# Patient Record
Sex: Female | Born: 1980 | Race: Black or African American | Hispanic: No | Marital: Married | Smoking: Current every day smoker
Health system: Southern US, Community
[De-identification: ages and names within clinical notes are randomized; demographics above are authoritative.]

## PROBLEM LIST (undated history)

## (undated) HISTORY — PX: KNEE ARTHROSCOPY: SHX127

---

## 2021-07-03 ENCOUNTER — Observation Stay (HOSPITAL_COMMUNITY): Payer: Medicaid - Out of State

## 2021-07-03 ENCOUNTER — Emergency Department (HOSPITAL_COMMUNITY): Payer: Medicaid - Out of State

## 2021-07-03 ENCOUNTER — Other Ambulatory Visit: Payer: Self-pay

## 2021-07-03 ENCOUNTER — Inpatient Hospital Stay (HOSPITAL_COMMUNITY)
Admission: EM | Admit: 2021-07-03 | Discharge: 2021-07-04 | DRG: 909 | Disposition: A | Discharge status: Home / Self Care | Payer: Medicaid - Out of State | Attending: Internal Medicine | Admitting: Internal Medicine

## 2021-07-03 DIAGNOSIS — T148XXA Other injury of unspecified body region, initial encounter: Principal | ICD-10-CM

## 2021-07-03 DIAGNOSIS — Z720 Tobacco use: Secondary | ICD-10-CM

## 2021-07-03 DIAGNOSIS — S8261XA Displaced fracture of lateral malleolus of right fibula, initial encounter for closed fracture: Secondary | ICD-10-CM | POA: Diagnosis present

## 2021-07-03 DIAGNOSIS — F17210 Nicotine dependence, cigarettes, uncomplicated: Secondary | ICD-10-CM | POA: Diagnosis present

## 2021-07-03 DIAGNOSIS — Z716 Tobacco abuse counseling: Secondary | ICD-10-CM

## 2021-07-03 DIAGNOSIS — S82891B Other fracture of right lower leg, initial encounter for open fracture type I or II: Secondary | ICD-10-CM

## 2021-07-03 DIAGNOSIS — Z23 Encounter for immunization: Secondary | ICD-10-CM

## 2021-07-03 DIAGNOSIS — S82891A Other fracture of right lower leg, initial encounter for closed fracture: Secondary | ICD-10-CM | POA: Diagnosis present

## 2021-07-03 DIAGNOSIS — W230XXA Caught, crushed, jammed, or pinched between moving objects, initial encounter: Secondary | ICD-10-CM | POA: Diagnosis present

## 2021-07-03 DIAGNOSIS — Z6833 Body mass index (BMI) 33.0-33.9, adult: Secondary | ICD-10-CM

## 2021-07-03 DIAGNOSIS — Z20822 Contact with and (suspected) exposure to covid-19: Secondary | ICD-10-CM | POA: Diagnosis present

## 2021-07-03 DIAGNOSIS — S8781XA Crushing injury of right lower leg, initial encounter: Principal | ICD-10-CM | POA: Diagnosis present

## 2021-07-03 DIAGNOSIS — W1839XA Other fall on same level, initial encounter: Secondary | ICD-10-CM | POA: Diagnosis present

## 2021-07-03 DIAGNOSIS — S82899A Other fracture of unspecified lower leg, initial encounter for closed fracture: Secondary | ICD-10-CM

## 2021-07-03 DIAGNOSIS — E669 Obesity, unspecified: Secondary | ICD-10-CM | POA: Diagnosis present

## 2021-07-03 HISTORY — DX: Other fracture of unspecified lower leg, initial encounter for closed fracture: S82.899A

## 2021-07-03 LAB — COMPREHENSIVE METABOLIC PANEL
ALT: 10 U/L (ref 0–44)
AST: 18 U/L (ref 15–41)
Albumin: 4.1 g/dL (ref 3.5–5.0)
Alkaline Phosphatase: 56 U/L (ref 38–126)
Anion gap: 9 (ref 5–15)
BUN: 10 mg/dL (ref 6–20)
CO2: 21 mmol/L — ABNORMAL LOW (ref 22–32)
Calcium: 9.2 mg/dL (ref 8.9–10.3)
Chloride: 110 mmol/L (ref 98–111)
Creatinine, Ser: 0.91 mg/dL (ref 0.44–1.00)
GFR, Estimated: >60 mL/min (ref >60)
Glucose, Bld: 114 mg/dL — ABNORMAL HIGH (ref 70–99)
Potassium: 3.7 mmol/L (ref 3.5–5.1)
Sodium: 140 mmol/L (ref 135–145)
Total Bilirubin: 0.4 mg/dL (ref 0.3–1.2)
Total Protein: 6.9 g/dL (ref 6.5–8.1)

## 2021-07-03 LAB — ETHANOL: Alcohol, Ethyl (B): <10 mg/dL (ref <10)

## 2021-07-03 LAB — CBC
HCT: 40.2 % (ref 36.0–46.0)
Hemoglobin: 13.5 g/dL (ref 12.0–15.0)
MCH: 29.6 pg (ref 26.0–34.0)
MCHC: 33.6 g/dL (ref 30.0–36.0)
MCV: 88.2 fL (ref 80.0–100.0)
Platelets: 171 10*3/uL (ref 150–400)
RBC: 4.56 MIL/uL (ref 3.87–5.11)
RDW: 13.4 % (ref 11.5–15.5)
WBC: 6.2 10*3/uL (ref 4.0–10.5)
nRBC: 0 % (ref 0.0–0.2)

## 2021-07-03 LAB — I-STAT CHEM 8, ED
BUN: 10 mg/dL (ref 6–20)
Calcium, Ion: 1.05 mmol/L — ABNORMAL LOW (ref 1.15–1.40)
Chloride: 109 mmol/L (ref 98–111)
Creatinine, Ser: 0.8 mg/dL (ref 0.44–1.00)
Glucose, Bld: 112 mg/dL — ABNORMAL HIGH (ref 70–99)
HCT: 40 % (ref 36.0–46.0)
Hemoglobin: 13.6 g/dL (ref 12.0–15.0)
Potassium: 3.8 mmol/L (ref 3.5–5.1)
Sodium: 141 mmol/L (ref 135–145)
TCO2: 23 mmol/L (ref 22–32)

## 2021-07-03 LAB — PROTIME-INR
INR: 1 (ref 0.8–1.2)
Prothrombin Time: 12.7 seconds (ref 11.4–15.2)

## 2021-07-03 LAB — SAMPLE TO BLOOD BANK

## 2021-07-03 LAB — LACTIC ACID, PLASMA: Lactic Acid, Venous: 2.1 mmol/L (ref 0.5–1.9)

## 2021-07-03 LAB — RESP PANEL BY RT-PCR (FLU A&B, COVID) ARPGX2
Influenza A by PCR: NEGATIVE
Influenza B by PCR: NEGATIVE
SARS Coronavirus 2 by RT PCR: NEGATIVE

## 2021-07-03 LAB — I-STAT BETA HCG BLOOD, ED (MC, WL, AP ONLY): I-stat hCG, quantitative: <5 m[IU]/mL (ref <5)

## 2021-07-03 IMAGING — CT CT TIBIA FIBULA *R* W/O CM
2 series · 10 of 14 positions shown, 12 images · non-contrast
Comparison: Right tib-fib, ankle and foot films earlier today.

CLINICAL DATA: Open fracture right ankle.

EXAM:
CT OF THE LOWER RIGHT EXTREMITY WITHOUT CONTRAST
TECHNIQUE: Multidetector CT imaging of the right lower extremity was performed
according to the standard protocol.
RADIATION DOSE REDUCTION: This exam was performed according to the
departmental dose-optimization program which includes automated
exposure control, adjustment of the mA and/or kV according to
patient size and/or use of iterative reconstruction technique.

[Series 4: rt tib/fib 1.5 ax st · axial · 0.31mm/px · z∈[+102,+406]mm · 5 of 310 slices shown]
[im 52/310  bone]
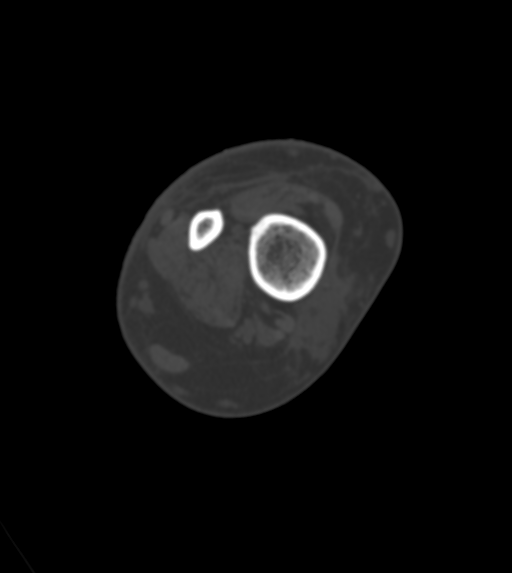
[im 104/310  bone]
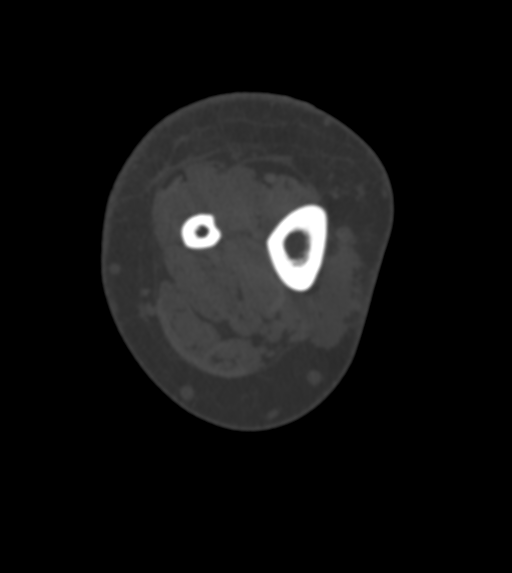
[im 155/310  bone]
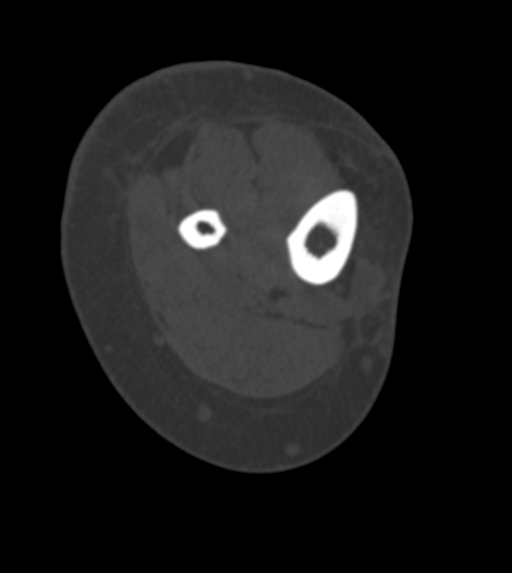
[im 207/310  bone]
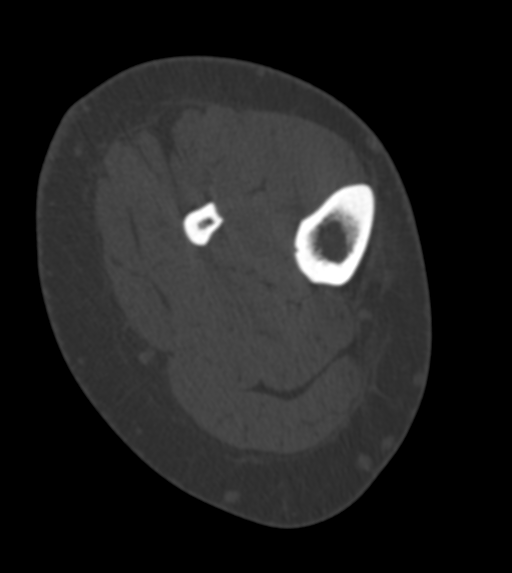
[im 258/310  bone]
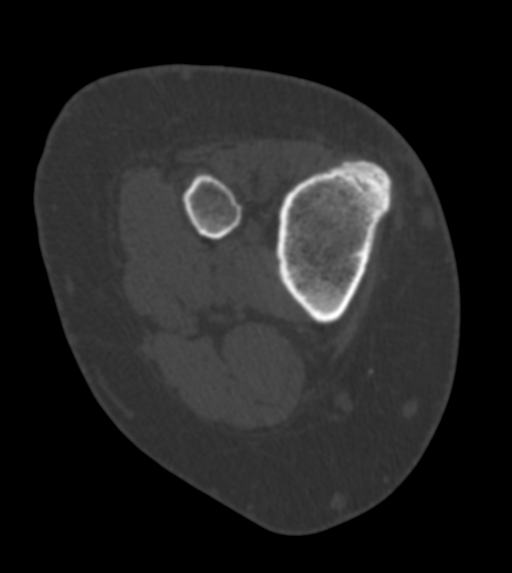

[Series 12: rt tib/fib ax (id) bone · axial · 0.31mm/px · z∈[+102,+408]mm · 5 of 311 slices shown, 7 images]
[im 52/311  soft-tissue]
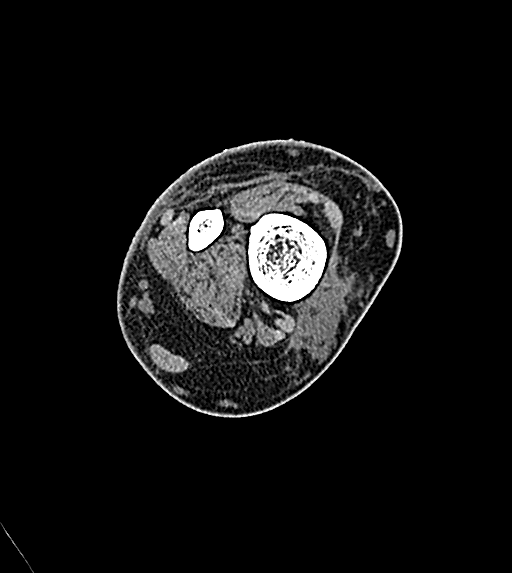
[im 52/311  bone]
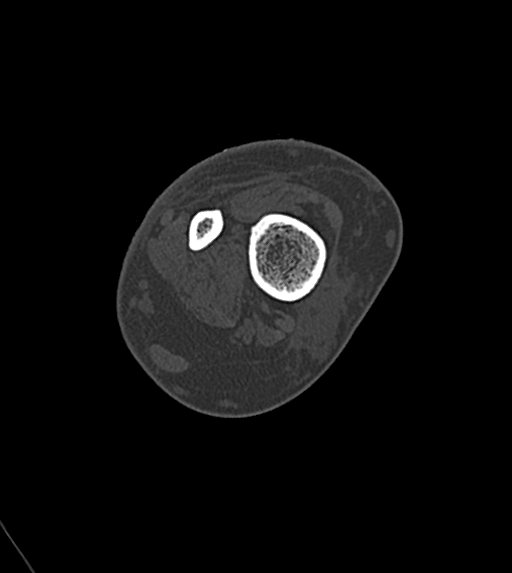
[im 104/311  bone]
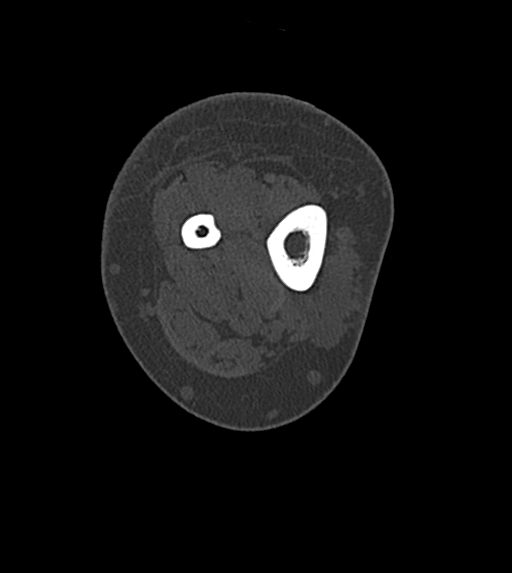
[im 156/311  bone]
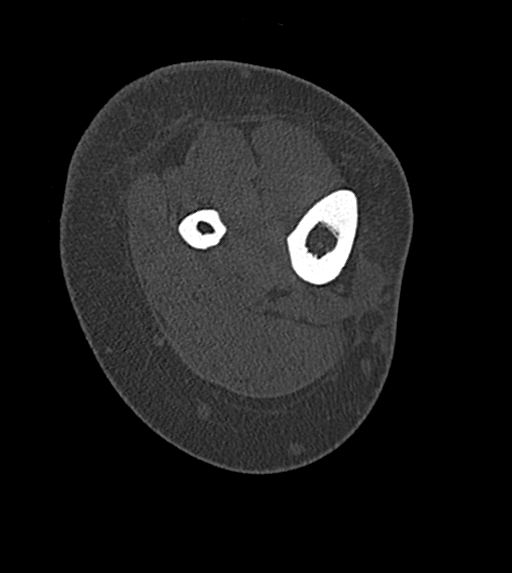
[im 207/311  bone]
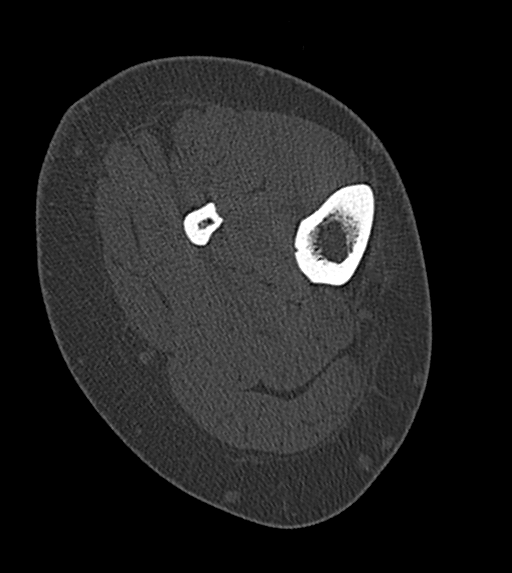
[im 259/311  soft-tissue]
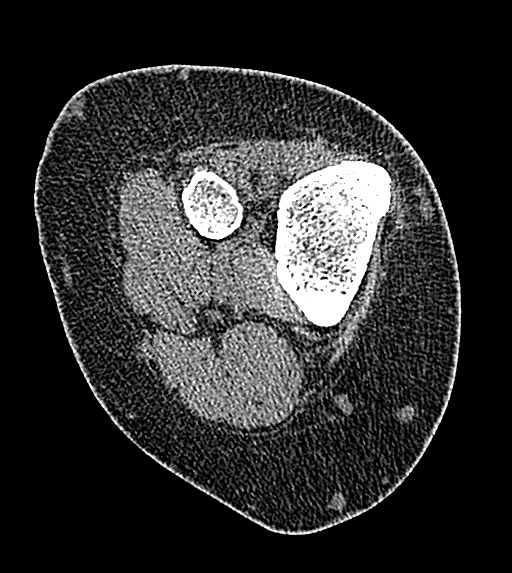
[im 259/311  bone]
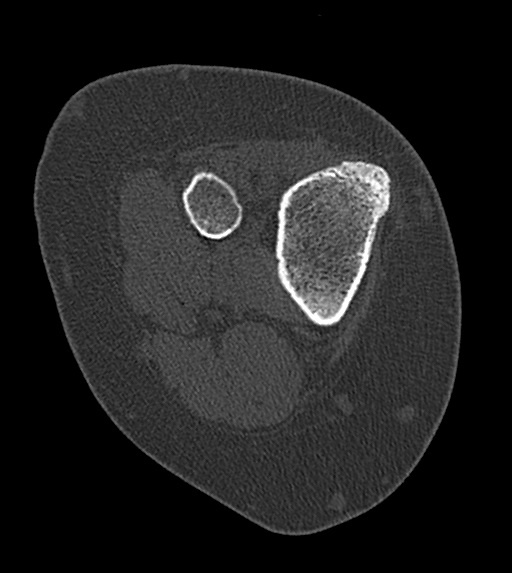

[10 of 14 positions shown; findings below may reference images not displayed]

FINDINGS: Bones/Joint/Cartilage

Normal bone mineralization. There is a comminuted open fracture
involving the distal half of the lateral malleolus with some of the
fragments nondisplaced and some of the more lateral fragments
displaced slightly laterally. There is an overlying open skin wound
extending down to the bone, with air in the fibulotalar and
tibiotalar joint space.

There also is a nondisplaced intra-articular fracture of the lateral
aspect of the head of the fibula along the proximal tibiofibular
joint. This is best seen on the coronal reformatted images.

Remainder of the fibula, as well as the tibia, the talus and talar
dome, and the superior aspect of the calcaneus are intact.

The joint spaces are maintained including along the ankle mortise.
There is mild spurring at the femorotibial joint margins.

Ligaments

Suboptimally assessed by CT.

Muscles and Tendons

Also not well evaluated with CT. There is normal muscle bulk in the
foreleg. Major tendons are visible with no through and through
tendon tears being seen. Tendon strains and lacerations are not
evaluated for with this technique. The peroneal tendons are just
behind the lateral malleolus and could be contused or lacerated
given the mechanism of injury, but they are both visible.

Soft tissues

As above there is an open wound along the lateral malleolus
extending down to the bone, with tibiotalar and fibulotalar
intra-articular gas and moderate swelling is seen in the area as
well as anteriorly and medially at the level of the ankle.

There is nonlocalizing fluid or blood extending along the medial fat
muscle interfaces in the distal foreleg down to the ankle level.

Incidentally noted are varicose veins in the subcutaneous depth at
the level of the knee and foreleg.
IMPRESSION: 1. Comminuted open fracture of the distal lateral malleolus with
intra-articular air and overlying open wound.
2. Nondisplaced intra-articular fracture of the lateral aspect of
the fibular head.
3. No further evidence of fractures. Early degenerative change of
the femorotibial joint.
4. Nonlocalizing fluid or blood extending along the fat muscle
interfaces medially in the mid to distal foreleg. No intramuscular
hematoma is seen.
5. Ligaments and tendons not well evaluated with this technique. The
peroneal tendons are just posterior to the lateral malleolus
however, and could have undergone contusion or laceration.
6. Varicose veins.

## 2021-07-03 IMAGING — DX DG FOOT 2V*R*
2 series · 2 of 2 positions shown · non-contrast
Comparison: None.

CLINICAL DATA: Trauma.

EXAM:
RIGHT FOOT - 2 VIEW

[foot]
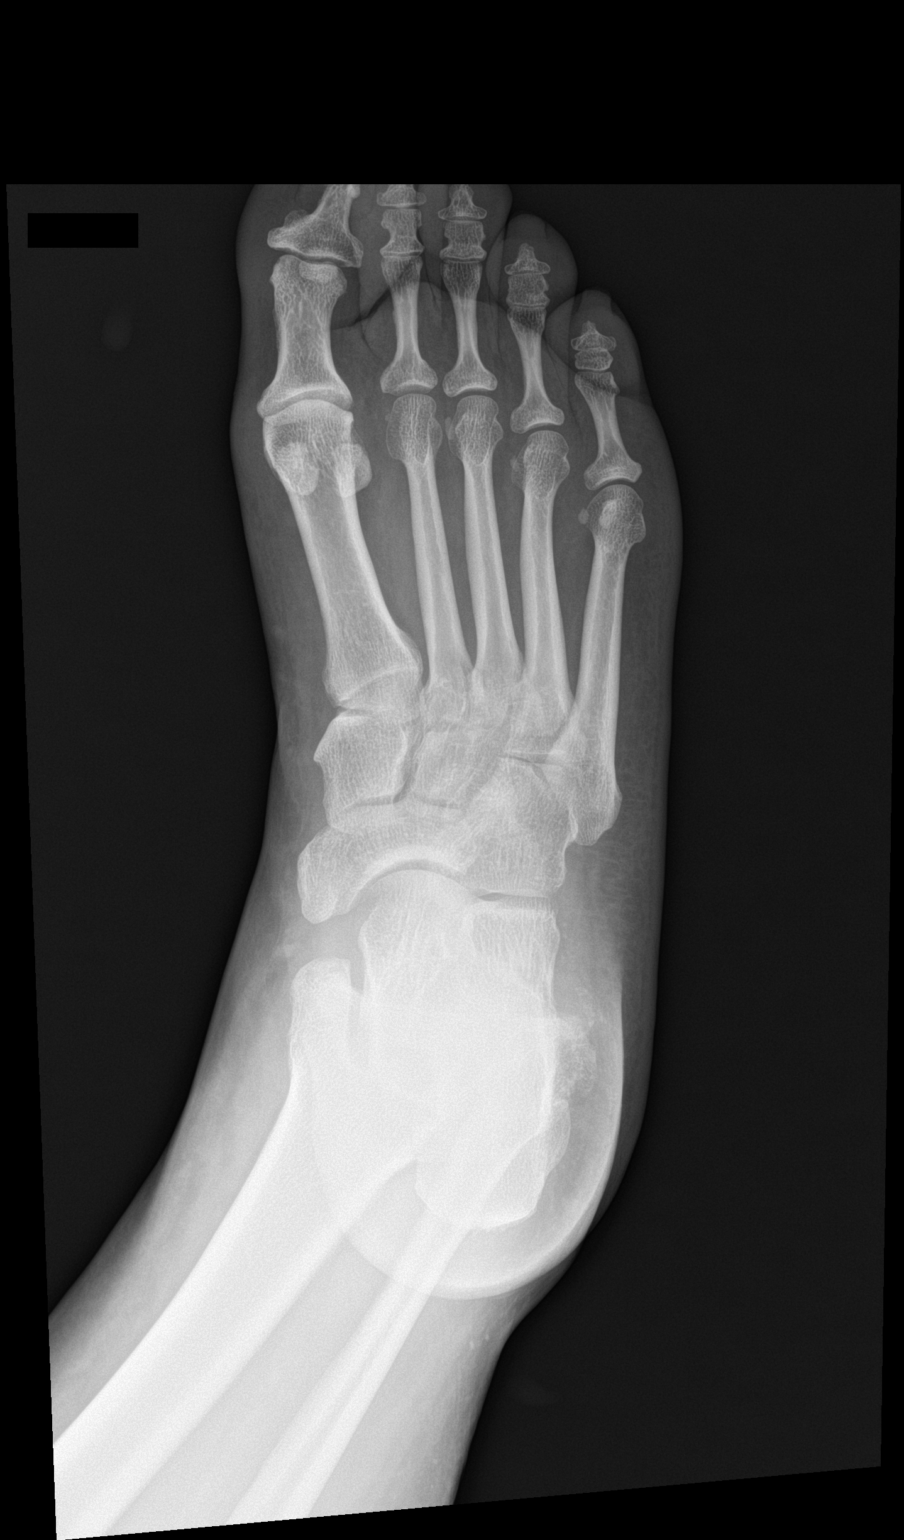

[leg]
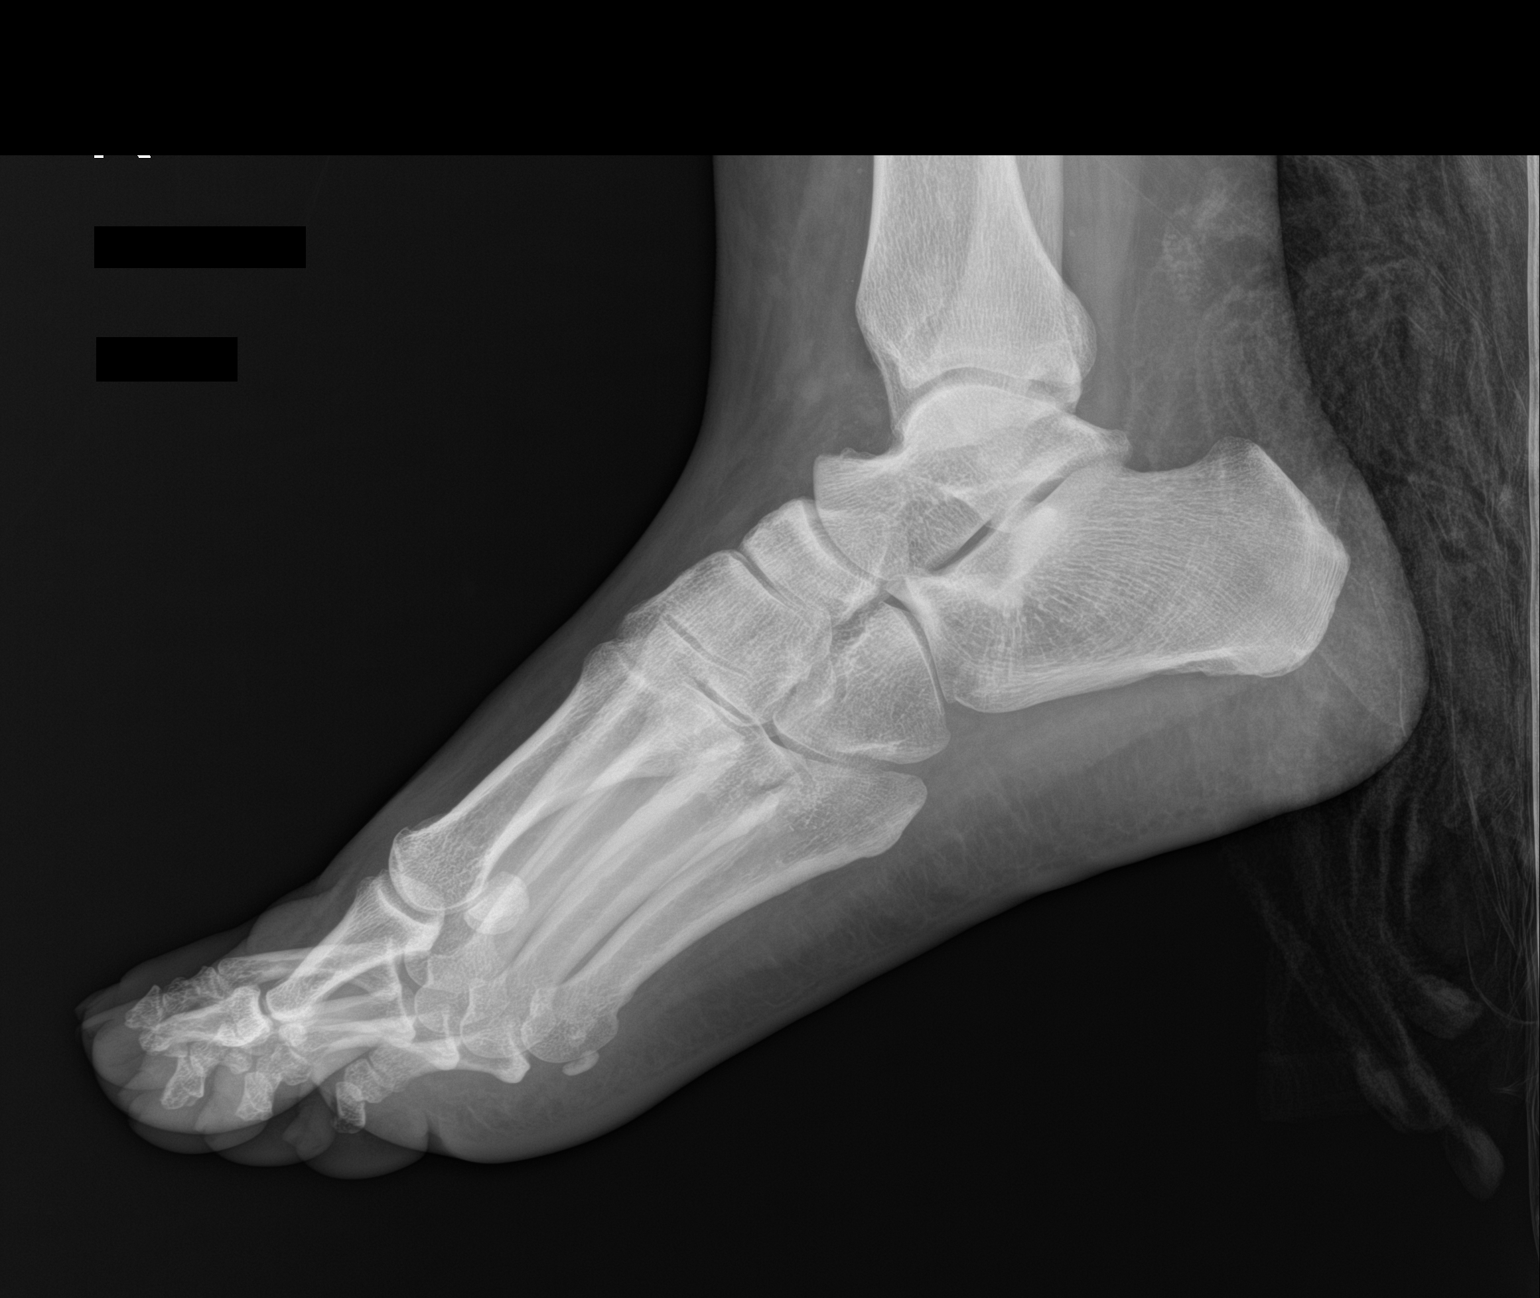

[2 of 2 positions shown; findings below may reference images not displayed]

FINDINGS: There is no evidence of fracture or dislocation. There is no
evidence of arthropathy or other focal bone abnormality. Soft
tissues are unremarkable.
IMPRESSION: Negative.

## 2021-07-03 IMAGING — DX DG TIBIA/FIBULA 2V*R*
1 series · 3 of 3 positions shown · non-contrast
Comparison: None.

CLINICAL DATA: Trauma

EXAM:
RIGHT TIBIA AND FIBULA - 2 VIEW

[Series 1: leg · 0.14mm/px · 3 of 3 slices shown]
[im 1/3]
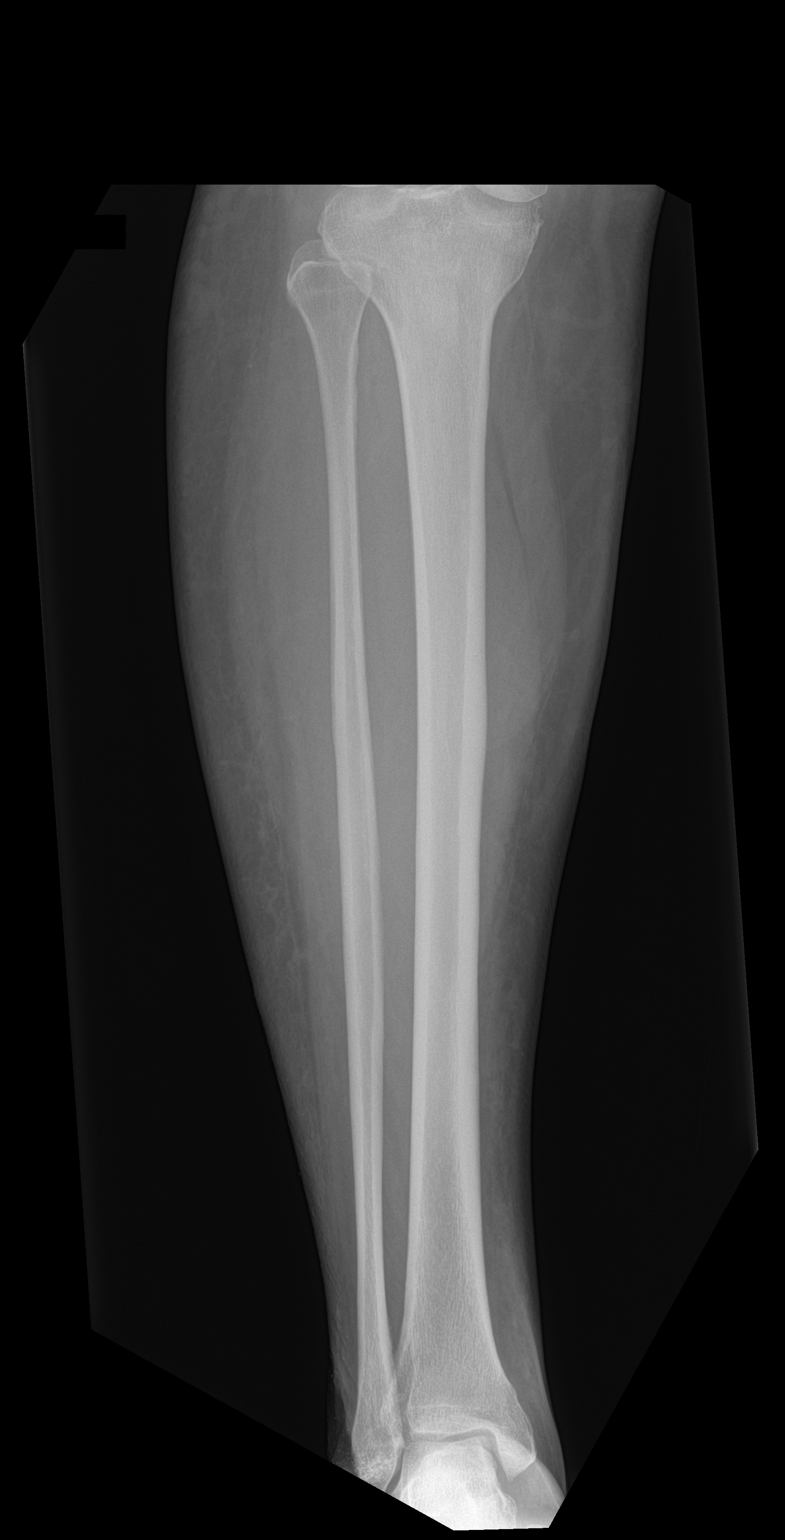
[im 2/3]
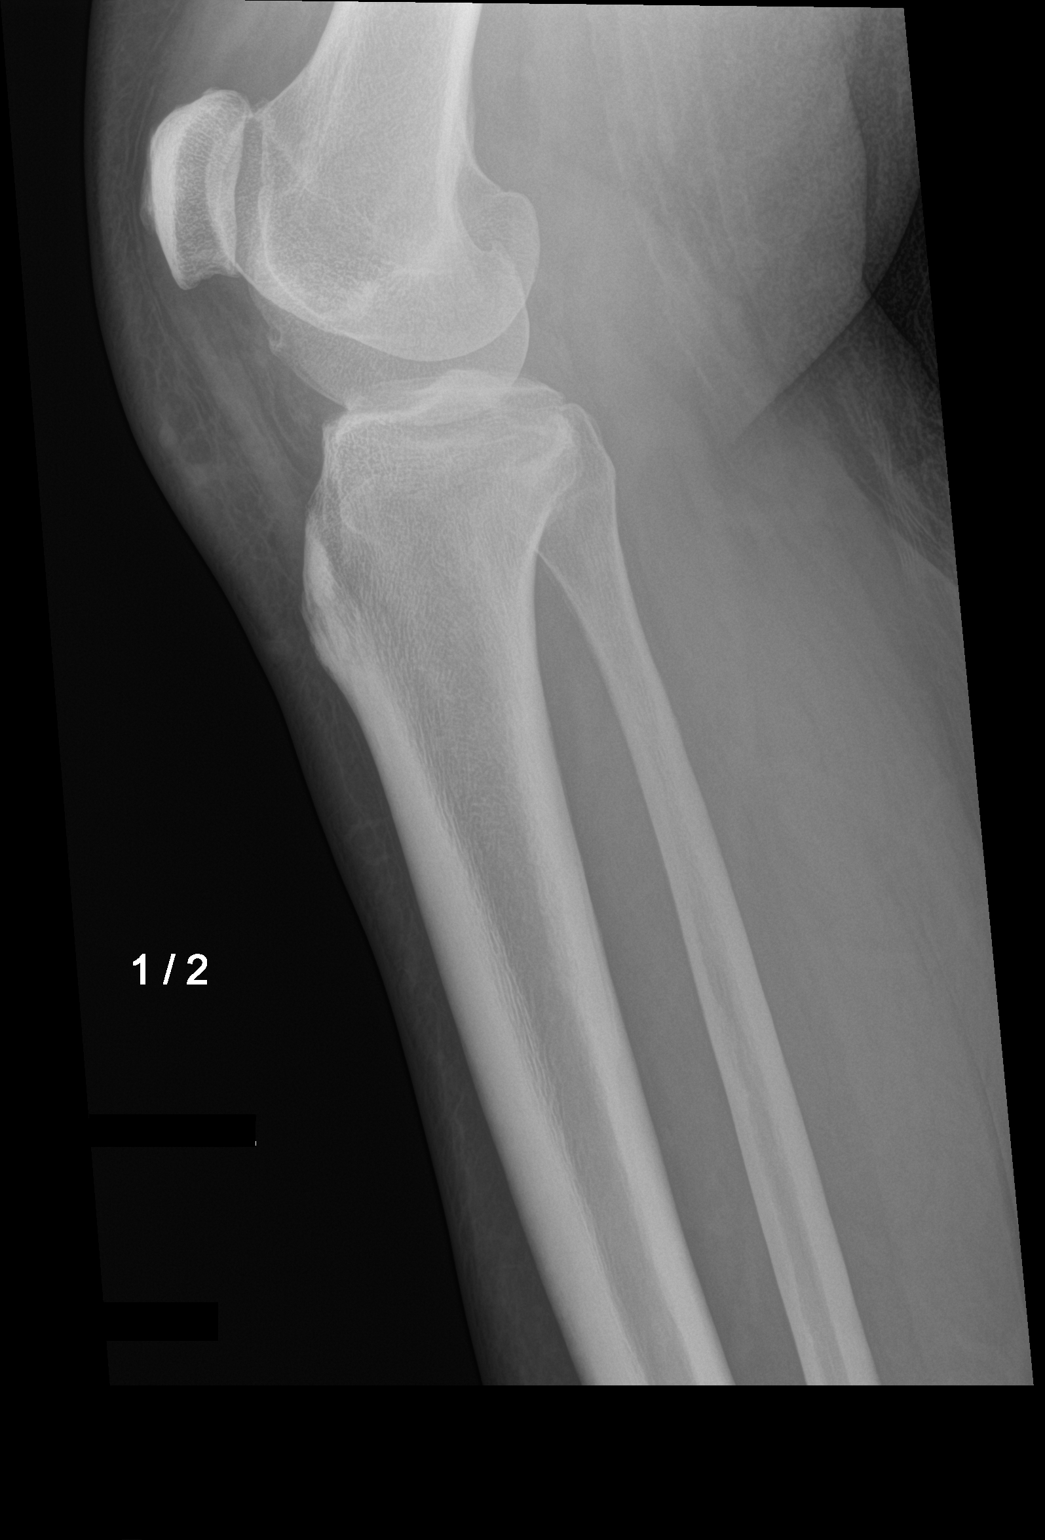
[im 3/3]
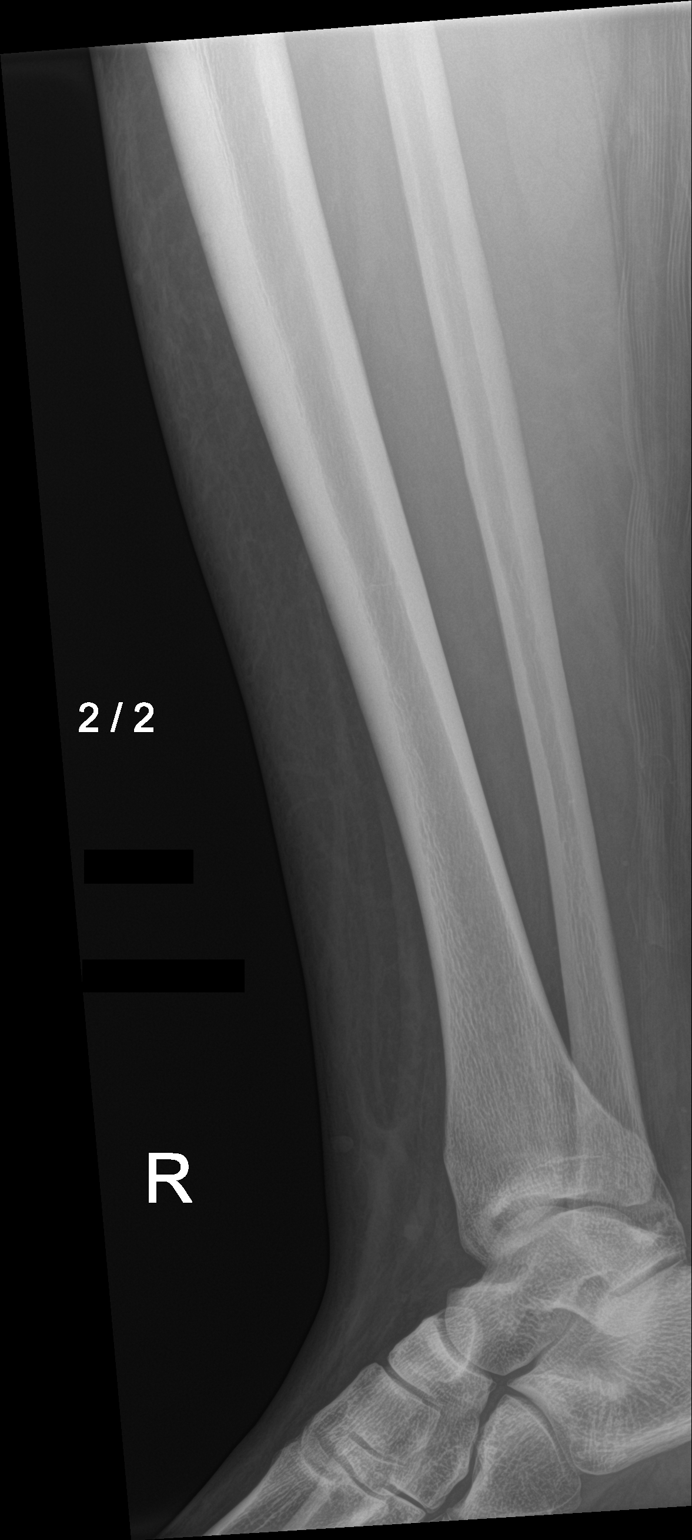

[3 of 3 positions shown; findings below may reference images not displayed]

FINDINGS: There is no evidence of fracture or other focal bone lesions. Soft
tissues are unremarkable.
IMPRESSION: Negative.
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
## 2021-07-03 MED ORDER — ONDANSETRON HCL 4 MG/2ML IJ SOLN
4.0000 mg | Freq: Four times a day (QID) | INTRAMUSCULAR | Status: DC | PRN
Start: 2021-07-03 — End: 2021-07-04

## 2021-07-03 MED ORDER — MAGNESIUM HYDROXIDE 400 MG/5ML PO SUSP
30.0000 mL | Freq: Every day | ORAL | Status: DC | PRN
Start: 2021-07-03 — End: 2021-07-04
  Filled 2021-07-03: qty 30

## 2021-07-03 MED ORDER — TRAZODONE HCL 50 MG PO TABS: 25.0000 mg | ORAL_TABLET | Freq: Every evening | ORAL | Status: DC | PRN

## 2021-07-03 MED ORDER — HYDROMORPHONE HCL 1 MG/ML IJ SOLN
1.0000 mg | INTRAMUSCULAR | Status: AC | PRN
  Administered 2021-07-03 – 2021-07-04 (×3): 1 mg via INTRAVENOUS
  Filled 2021-07-03 (×3): qty 1

## 2021-07-03 MED ORDER — FENTANYL CITRATE PF 50 MCG/ML IJ SOSY: 50.0000 ug | PREFILLED_SYRINGE | Freq: Once | INTRAMUSCULAR | Status: AC

## 2021-07-03 MED ORDER — HYDROMORPHONE HCL 1 MG/ML IJ SOLN
0.5000 mg | Freq: Once | INTRAMUSCULAR | Status: AC
  Administered 2021-07-03: 0.5 mg via INTRAVENOUS
  Filled 2021-07-03: qty 1

## 2021-07-03 MED ORDER — TETANUS-DIPHTH-ACELL PERTUSSIS 5-2.5-18.5 LF-MCG/0.5 IM SUSY
0.5000 mL | PREFILLED_SYRINGE | Freq: Once | INTRAMUSCULAR | Status: AC
  Administered 2021-07-03: 0.5 mL via INTRAMUSCULAR
  Filled 2021-07-03: qty 0.5

## 2021-07-03 MED ORDER — PROPOFOL 10 MG/ML IV BOLUS: 0.5000 mg/kg | Freq: Once | INTRAVENOUS | Status: DC

## 2021-07-03 MED ORDER — ACETAMINOPHEN 650 MG RE SUPP: 650.0000 mg | Freq: Four times a day (QID) | RECTAL | Status: DC | PRN

## 2021-07-03 MED ORDER — ENOXAPARIN SODIUM 60 MG/0.6ML IJ SOSY
50.0000 mg | PREFILLED_SYRINGE | INTRAMUSCULAR | Status: DC
  Administered 2021-07-04: 50 mg via SUBCUTANEOUS
  Filled 2021-07-03 (×2): qty 0.5

## 2021-07-03 MED ORDER — HYDROMORPHONE HCL 1 MG/ML IJ SOLN: 1.0000 mg | Freq: Once | INTRAMUSCULAR | Status: DC

## 2021-07-03 MED ORDER — ACETAMINOPHEN 325 MG PO TABS
650.0000 mg | ORAL_TABLET | Freq: Four times a day (QID) | ORAL | Status: DC | PRN
  Administered 2021-07-04: 650 mg via ORAL
  Filled 2021-07-03: qty 2

## 2021-07-03 MED ORDER — FENTANYL CITRATE PF 50 MCG/ML IJ SOSY
PREFILLED_SYRINGE | INTRAMUSCULAR | Status: AC
  Administered 2021-07-03: 50 ug
  Filled 2021-07-03: qty 1

## 2021-07-03 MED ORDER — HYDROMORPHONE HCL 1 MG/ML IJ SOLN
1.0000 mg | Freq: Once | INTRAMUSCULAR | Status: AC
Start: 2021-07-03 — End: 2021-07-03
  Administered 2021-07-03: 1 mg via INTRAVENOUS
  Filled 2021-07-03: qty 1

## 2021-07-03 MED ORDER — CEFAZOLIN SODIUM-DEXTROSE 2-4 GM/100ML-% IV SOLN
2.0000 g | Freq: Once | INTRAVENOUS | Status: AC
  Administered 2021-07-03: 2 g via INTRAVENOUS

## 2021-07-03 MED ORDER — MORPHINE SULFATE (PF) 2 MG/ML IV SOLN
2.0000 mg | INTRAVENOUS | Status: DC | PRN
  Administered 2021-07-04 (×3): 2 mg via INTRAVENOUS
  Filled 2021-07-03 (×3): qty 1

## 2021-07-03 MED ORDER — FENTANYL CITRATE PF 50 MCG/ML IJ SOSY
PREFILLED_SYRINGE | INTRAMUSCULAR | Status: AC
  Administered 2021-07-03: 50 ug via INTRAVENOUS
  Filled 2021-07-03: qty 1

## 2021-07-03 MED ORDER — ONDANSETRON HCL 4 MG PO TABS: 4.0000 mg | ORAL_TABLET | Freq: Four times a day (QID) | ORAL | Status: DC | PRN
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
## 2021-07-03 NOTE — ED Notes (Signed)
MD notified pt requesting pain meds at this time  ?

## 2021-07-03 NOTE — ED Notes (Signed)
Transition of Care (TOC) - CAGE-AID Screening ? ? ?Patient Details  ?Name: Amy Madden ?MRN: [id number] ?Date of Birth: 1980-12-03 ? ?Clinical Narrative: ? ?Patient endorses THC use, but no other illicit drugs or alcohol. Denies need for resources at this time. ? ?CAGE-AID Screening: ?  ? ?Have You Ever Felt You Ought to Cut Down on Your Drinking or Drug Use?: No ?Have People Annoyed You By Critizing Your Drinking Or Drug Use?: No ?Have You Felt Bad Or Guilty About Your Drinking Or Drug Use?: No ?Have You Ever Had a Drink or Used Drugs First Thing In The Morning to Steady Your Nerves or to Get Rid of a Hangover?: No ?CAGE-AID Score: 0 ? ?Substance Abuse Education Offered: No ? ?  ? ? ? ? ? ? ?

## 2021-07-03 NOTE — ED Notes (Signed)
Ortho at bedside.

## 2021-07-03 NOTE — ED Notes (Signed)
Got patient on the monitor patient is resting with doctor and nurses at bedside ?

## 2021-07-03 NOTE — ED Notes (Signed)
Ortho at bedside cleaning wound

## 2021-07-03 NOTE — ED Notes (Addendum)
Xr at bedside
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
## 2021-07-04 ENCOUNTER — Telehealth (HOSPITAL_COMMUNITY): Payer: Self-pay | Admitting: Emergency Medicine

## 2021-07-04 DIAGNOSIS — Z20822 Contact with and (suspected) exposure to covid-19: Secondary | ICD-10-CM | POA: Diagnosis present

## 2021-07-04 DIAGNOSIS — E669 Obesity, unspecified: Secondary | ICD-10-CM | POA: Diagnosis present

## 2021-07-04 DIAGNOSIS — F17210 Nicotine dependence, cigarettes, uncomplicated: Secondary | ICD-10-CM | POA: Diagnosis present

## 2021-07-04 DIAGNOSIS — T148XXA Other injury of unspecified body region, initial encounter: Secondary | ICD-10-CM

## 2021-07-04 DIAGNOSIS — W1839XA Other fall on same level, initial encounter: Secondary | ICD-10-CM | POA: Diagnosis present

## 2021-07-04 DIAGNOSIS — S8781XA Crushing injury of right lower leg, initial encounter: Secondary | ICD-10-CM | POA: Diagnosis present

## 2021-07-04 DIAGNOSIS — W230XXA Caught, crushed, jammed, or pinched between moving objects, initial encounter: Secondary | ICD-10-CM | POA: Diagnosis present

## 2021-07-04 DIAGNOSIS — Z720 Tobacco use: Secondary | ICD-10-CM

## 2021-07-04 DIAGNOSIS — S82891A Other fracture of right lower leg, initial encounter for closed fracture: Secondary | ICD-10-CM

## 2021-07-04 DIAGNOSIS — Z23 Encounter for immunization: Secondary | ICD-10-CM | POA: Diagnosis not present

## 2021-07-04 DIAGNOSIS — Z6833 Body mass index (BMI) 33.0-33.9, adult: Secondary | ICD-10-CM | POA: Diagnosis not present

## 2021-07-04 DIAGNOSIS — S8261XA Displaced fracture of lateral malleolus of right fibula, initial encounter for closed fracture: Secondary | ICD-10-CM | POA: Diagnosis present

## 2021-07-04 DIAGNOSIS — Z716 Tobacco abuse counseling: Secondary | ICD-10-CM | POA: Diagnosis not present

## 2021-07-04 LAB — BASIC METABOLIC PANEL
Anion gap: 7 (ref 5–15)
BUN: 7 mg/dL (ref 6–20)
CO2: 21 mmol/L — ABNORMAL LOW (ref 22–32)
Calcium: 8.6 mg/dL — ABNORMAL LOW (ref 8.9–10.3)
Chloride: 109 mmol/L (ref 98–111)
Creatinine, Ser: 0.69 mg/dL (ref 0.44–1.00)
GFR, Estimated: >60 mL/min (ref >60)
Glucose, Bld: 100 mg/dL — ABNORMAL HIGH (ref 70–99)
Potassium: 3.8 mmol/L (ref 3.5–5.1)
Sodium: 137 mmol/L (ref 135–145)

## 2021-07-04 LAB — CBC
HCT: 38.4 % (ref 36.0–46.0)
Hemoglobin: 12.3 g/dL (ref 12.0–15.0)
MCH: 28.7 pg (ref 26.0–34.0)
MCHC: 32 g/dL (ref 30.0–36.0)
MCV: 89.5 fL (ref 80.0–100.0)
Platelets: 154 10*3/uL (ref 150–400)
RBC: 4.29 MIL/uL (ref 3.87–5.11)
RDW: 13.5 % (ref 11.5–15.5)
WBC: 5.7 10*3/uL (ref 4.0–10.5)
nRBC: 0 % (ref 0.0–0.2)

## 2021-07-04 LAB — CK: Total CK: 121 U/L (ref 38–234)

## 2021-07-04 LAB — HIV ANTIBODY (ROUTINE TESTING W REFLEX): HIV Screen 4th Generation wRfx: NONREACTIVE

## 2021-07-04 LAB — LACTIC ACID, PLASMA: Lactic Acid, Venous: 0.7 mmol/L (ref 0.5–1.9)

## 2021-07-04 MED ORDER — DEXTROSE-NACL 5-0.45 % IV SOLN: INTRAVENOUS | Status: DC

## 2021-07-04 MED ORDER — OXYCODONE HCL 5 MG PO TABS
5.0000 mg | ORAL_TABLET | ORAL | Status: DC | PRN
  Administered 2021-07-04: 5 mg via ORAL
  Filled 2021-07-04: qty 1

## 2021-07-04 MED ORDER — CEPHALEXIN 500 MG PO CAPS: ORAL_CAPSULE | ORAL | 0 refills | Status: DC

## 2021-07-04 MED ORDER — POLYETHYLENE GLYCOL 3350 17 G PO PACK: 17.0000 g | PACK | Freq: Every day | ORAL | Status: DC | PRN

## 2021-07-04 MED ORDER — CEFAZOLIN SODIUM-DEXTROSE 2-4 GM/100ML-% IV SOLN
2.0000 g | Freq: Three times a day (TID) | INTRAVENOUS | Status: DC
  Administered 2021-07-04: 2 g via INTRAVENOUS
  Filled 2021-07-04 (×4): qty 100

## 2021-07-04 MED ORDER — MORPHINE SULFATE 15 MG PO TABS: 7.5000 mg | ORAL_TABLET | ORAL | 0 refills | Status: DC | PRN

## 2021-07-04 MED ORDER — DOCUSATE SODIUM 100 MG PO CAPS
100.0000 mg | ORAL_CAPSULE | Freq: Two times a day (BID) | ORAL | Status: DC
  Administered 2021-07-04: 100 mg via ORAL
  Filled 2021-07-04: qty 1

## 2021-07-04 NOTE — Progress Notes (Signed)
Orthopedic Tech Progress Note ?Patient Details:  ?Amy Madden ?06-14-1980 ?[id number] ? ?Ortho Devices ?Type of Ortho Device: Crutches, CAM walker ?Ortho Device/Splint Location: RLE ?Ortho Device/Splint Interventions: Ordered, Application, Adjustment ?  ?Post Interventions ?Patient Tolerated: Well, Ambulated well ?Instructions Provided: Poper ambulation with device, Care of device ? ?Donald Pore ?07/04/2021, 2:05 PM ? ?
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
## 2021-07-04 NOTE — Discharge Instructions (Signed)
Take 4 over the counter ibuprofen tablets 3 times a day or 2 over-the-counter naproxen tablets twice a day for pain. ?Also take tylenol 1000mg (2 extra strength) four times a day.  ? ?Then take the pain medicine if you feel like you need it. Narcotics do not help with the pain, they only make you care about it less.  You can become addicted to this, people may break into your house to steal it.  It will constipate you.  If you drive under the influence of this medicine you can get a DUI.   ? ?You need to try and be off of your leg is much as you can.  Try to elevate it above your heart.  These type of injuries can very easily get much worse.  If your pain worsens you need to return to the emergency department for evaluation.  Please change the dressing once a day.  The plastic surgeon should be able to see you in the office at 10 AM on Monday. ?

## 2021-07-04 NOTE — ED Notes (Signed)
Admit MD at bedside
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
## 2021-07-04 NOTE — Telephone Encounter (Signed)
Antibiotics sent to the pharmacy.

## 2021-07-04 NOTE — ED Notes (Signed)
Surgery at bedside.

## 2021-07-04 NOTE — Assessment & Plan Note (Addendum)
-   The patient will be admitted to a medical-surgical observation bed. ?- Pain management will be provided. ?- Plastic surgery consult will be obtained.  It can be called in AM. ?- We will cover with empiric antibiotic therapy with IV Ancef given large open wound. ?

## 2021-07-04 NOTE — ED Notes (Signed)
Ortho PA stated for plastic surgery consult for wound management and possible discharge, MD notified  ?

## 2021-07-04 NOTE — Assessment & Plan Note (Signed)
-   She was counseled for smoking cessation and she will receive further counseling here. ?
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
## 2021-07-04 NOTE — ED Notes (Signed)
Awaiting ortho tech help to place boot and crutches  ?

## 2021-07-04 NOTE — ED Notes (Signed)
Pt has purwik in place, urine color is red, pt reports on menstrual cycle.  ?

## 2021-07-07 ENCOUNTER — Other Ambulatory Visit: Payer: Self-pay

## 2021-07-07 ENCOUNTER — Encounter: Payer: Self-pay | Admitting: Plastic Surgery

## 2021-07-07 ENCOUNTER — Encounter (HOSPITAL_BASED_OUTPATIENT_CLINIC_OR_DEPARTMENT_OTHER): Payer: Self-pay | Admitting: Plastic Surgery

## 2021-07-07 ENCOUNTER — Ambulatory Visit (INDEPENDENT_AMBULATORY_CARE_PROVIDER_SITE_OTHER): Payer: Self-pay | Admitting: Plastic Surgery

## 2021-07-07 DIAGNOSIS — S81801A Unspecified open wound, right lower leg, initial encounter: Secondary | ICD-10-CM

## 2021-07-07 DIAGNOSIS — S82891A Other fracture of right lower leg, initial encounter for closed fracture: Secondary | ICD-10-CM
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
## 2021-07-08 ENCOUNTER — Telehealth: Payer: Self-pay | Admitting: Plastic Surgery

## 2021-07-08 NOTE — Telephone Encounter (Signed)
Brooke from [facility] called for Pre-Op consent orders ?

## 2021-07-09 ENCOUNTER — Ambulatory Visit (HOSPITAL_BASED_OUTPATIENT_CLINIC_OR_DEPARTMENT_OTHER)
Admission: RE | Admit: 2021-07-09 | Discharge: 2021-07-09 | Disposition: A | Discharge status: Home / Self Care | Payer: Medicaid - Out of State | Attending: Plastic Surgery | Admitting: Plastic Surgery

## 2021-07-09 ENCOUNTER — Other Ambulatory Visit: Payer: Self-pay

## 2021-07-09 ENCOUNTER — Encounter (HOSPITAL_BASED_OUTPATIENT_CLINIC_OR_DEPARTMENT_OTHER)
Admission: RE | Disposition: A | Discharge status: Home / Self Care | Payer: Self-pay | Source: Home / Self Care | Attending: Plastic Surgery

## 2021-07-09 ENCOUNTER — Ambulatory Visit (HOSPITAL_BASED_OUTPATIENT_CLINIC_OR_DEPARTMENT_OTHER): Payer: Medicaid - Out of State | Admitting: Anesthesiology

## 2021-07-09 ENCOUNTER — Encounter (HOSPITAL_BASED_OUTPATIENT_CLINIC_OR_DEPARTMENT_OTHER): Payer: Self-pay | Admitting: Plastic Surgery

## 2021-07-09 DIAGNOSIS — S81801A Unspecified open wound, right lower leg, initial encounter: Secondary | ICD-10-CM | POA: Diagnosis present

## 2021-07-09 DIAGNOSIS — F17210 Nicotine dependence, cigarettes, uncomplicated: Secondary | ICD-10-CM | POA: Insufficient documentation

## 2021-07-09 DIAGNOSIS — S82891A Other fracture of right lower leg, initial encounter for closed fracture: Secondary | ICD-10-CM

## 2021-07-09 DIAGNOSIS — Y92014 Private driveway to single-family (private) house as the place of occurrence of the external cause: Secondary | ICD-10-CM | POA: Diagnosis not present

## 2021-07-09 HISTORY — PX: INCISION AND DRAINAGE OF WOUND: SHX1803

## 2021-07-09 LAB — POCT PREGNANCY, URINE: Preg Test, Ur: NEGATIVE

## 2021-07-09 SURGERY — IRRIGATION AND DEBRIDEMENT WOUND
Anesthesia: General | Site: Ankle | Laterality: Right

## 2021-07-09 MED ORDER — PHENYLEPHRINE HCL (PRESSORS) 10 MG/ML IV SOLN
INTRAVENOUS | Status: AC
  Filled 2021-07-09: qty 1

## 2021-07-09 MED ORDER — CHLORHEXIDINE GLUCONATE CLOTH 2 % EX PADS: 6.0000 | MEDICATED_PAD | Freq: Once | CUTANEOUS | Status: DC

## 2021-07-09 MED ORDER — KETOROLAC TROMETHAMINE 30 MG/ML IJ SOLN: 30.0000 mg | Freq: Once | INTRAMUSCULAR | Status: DC | PRN

## 2021-07-09 MED ORDER — PROPOFOL 10 MG/ML IV BOLUS
INTRAVENOUS | Status: DC | PRN
  Administered 2021-07-09: 200 mg via INTRAVENOUS

## 2021-07-09 MED ORDER — OXYCODONE HCL 5 MG PO TABS: 5.0000 mg | ORAL_TABLET | Freq: Once | ORAL | Status: DC | PRN

## 2021-07-09 MED ORDER — FENTANYL CITRATE (PF) 100 MCG/2ML IJ SOLN
25.0000 ug | INTRAMUSCULAR | Status: DC | PRN
  Administered 2021-07-09 (×3): 25 ug via INTRAVENOUS

## 2021-07-09 MED ORDER — OXYCODONE HCL 5 MG PO CAPS: 5.0000 mg | ORAL_CAPSULE | Freq: Four times a day (QID) | ORAL | 0 refills | Status: AC | PRN

## 2021-07-09 MED ORDER — DEXAMETHASONE SODIUM PHOSPHATE 10 MG/ML IJ SOLN
INTRAMUSCULAR | Status: AC
  Filled 2021-07-09: qty 1

## 2021-07-09 MED ORDER — LIDOCAINE 2% (20 MG/ML) 5 ML SYRINGE
INTRAMUSCULAR | Status: AC
  Filled 2021-07-09: qty 5

## 2021-07-09 MED ORDER — CEFAZOLIN SODIUM-DEXTROSE 2-4 GM/100ML-% IV SOLN
INTRAVENOUS | Status: AC
  Filled 2021-07-09: qty 100

## 2021-07-09 MED ORDER — DEXAMETHASONE SODIUM PHOSPHATE 10 MG/ML IJ SOLN
INTRAMUSCULAR | Status: DC | PRN
  Administered 2021-07-09: 10 mg via INTRAVENOUS

## 2021-07-09 MED ORDER — EPHEDRINE 5 MG/ML INJ
INTRAVENOUS | Status: AC
  Filled 2021-07-09: qty 5

## 2021-07-09 MED ORDER — ONDANSETRON HCL 4 MG PO TABS: 4.0000 mg | ORAL_TABLET | Freq: Three times a day (TID) | ORAL | 0 refills | Status: DC | PRN

## 2021-07-09 MED ORDER — ONDANSETRON HCL 4 MG/2ML IJ SOLN: 4.0000 mg | Freq: Once | INTRAMUSCULAR | Status: DC | PRN

## 2021-07-09 MED ORDER — LIDOCAINE HCL (CARDIAC) PF 100 MG/5ML IV SOSY
PREFILLED_SYRINGE | INTRAVENOUS | Status: DC | PRN
  Administered 2021-07-09: 100 mg via INTRAVENOUS

## 2021-07-09 MED ORDER — FENTANYL CITRATE (PF) 100 MCG/2ML IJ SOLN
INTRAMUSCULAR | Status: DC | PRN
  Administered 2021-07-09: 25 ug via INTRAVENOUS
  Administered 2021-07-09: 50 ug via INTRAVENOUS
  Administered 2021-07-09: 25 ug via INTRAVENOUS

## 2021-07-09 MED ORDER — ONDANSETRON HCL 4 MG/2ML IJ SOLN
INTRAMUSCULAR | Status: AC
  Filled 2021-07-09: qty 2

## 2021-07-09 MED ORDER — FENTANYL CITRATE (PF) 100 MCG/2ML IJ SOLN
INTRAMUSCULAR | Status: AC
  Filled 2021-07-09: qty 2

## 2021-07-09 MED ORDER — LACTATED RINGERS IV SOLN: INTRAVENOUS | Status: DC

## 2021-07-09 MED ORDER — MIDAZOLAM HCL 2 MG/2ML IJ SOLN
INTRAMUSCULAR | Status: AC
  Filled 2021-07-09: qty 2

## 2021-07-09 MED ORDER — MIDAZOLAM HCL 5 MG/5ML IJ SOLN
INTRAMUSCULAR | Status: DC | PRN
  Administered 2021-07-09: 2 mg via INTRAVENOUS

## 2021-07-09 MED ORDER — KETOROLAC TROMETHAMINE 30 MG/ML IJ SOLN
INTRAMUSCULAR | Status: AC
  Filled 2021-07-09: qty 1

## 2021-07-09 MED ORDER — OXYCODONE HCL 5 MG/5ML PO SOLN: 5.0000 mg | Freq: Once | ORAL | Status: DC | PRN

## 2021-07-09 MED ORDER — PROPOFOL 10 MG/ML IV BOLUS
INTRAVENOUS | Status: AC
  Filled 2021-07-09: qty 20

## 2021-07-09 MED ORDER — ONDANSETRON HCL 4 MG/2ML IJ SOLN
INTRAMUSCULAR | Status: DC | PRN
  Administered 2021-07-09: 4 mg via INTRAVENOUS

## 2021-07-09 MED ORDER — CEFAZOLIN SODIUM-DEXTROSE 2-4 GM/100ML-% IV SOLN
2.0000 g | INTRAVENOUS | Status: AC
  Administered 2021-07-09: 2 g via INTRAVENOUS

## 2021-07-09 SURGICAL SUPPLY — 29 items
BLADE SURG 15 STRL LF DISP TIS (BLADE) ×1 IMPLANT
BLADE SURG 15 STRL SS (BLADE) ×4
COVER BACK TABLE 60X90IN (DRAPES) ×2 IMPLANT
COVER MAYO STAND STRL (DRAPES) ×2 IMPLANT
DRAPE LAPAROTOMY 100X72 PEDS (DRAPES) ×1 IMPLANT
DRSG CUTIMED SORBACT 7X9 (GAUZE/BANDAGES/DRESSINGS) ×1 IMPLANT
DRSG PAD ABDOMINAL 8X10 ST (GAUZE/BANDAGES/DRESSINGS) ×2 IMPLANT
ELECT REM PT RETURN 9FT ADLT (ELECTROSURGICAL) ×2
ELECTRODE REM PT RTRN 9FT ADLT (ELECTROSURGICAL) ×1 IMPLANT
GAUZE SPONGE 4X4 12PLY STRL (GAUZE/BANDAGES/DRESSINGS) ×2 IMPLANT
GLOVE BIO SURGEON STRL SZ 6.5 (GLOVE) ×2 IMPLANT
GOWN STRL REUS W/ TWL LRG LVL3 (GOWN DISPOSABLE) ×2 IMPLANT
GOWN STRL REUS W/TWL LRG LVL3 (GOWN DISPOSABLE) ×6
GRAFT MYRIAD 3 LAYER 10X20 (Graft) ×1 IMPLANT
NDL HYPO 25X1 1.5 SAFETY (NEEDLE) IMPLANT
NEEDLE HYPO 25X1 1.5 SAFETY (NEEDLE) ×2 IMPLANT
NS IRRIG 1000ML POUR BTL (IV SOLUTION) ×2 IMPLANT
PACK BASIN DAY SURGERY FS (CUSTOM PROCEDURE TRAY) ×2 IMPLANT
PENCIL SMOKE EVACUATOR (MISCELLANEOUS) ×2 IMPLANT
SLEEVE SCD COMPRESS KNEE MED (STOCKING) ×1 IMPLANT
SPONGE T-LAP 18X18 ~~LOC~~+RFID (SPONGE) ×2 IMPLANT
STAPLER VISISTAT 35W (STAPLE) ×1 IMPLANT
SURGILUBE 2OZ TUBE FLIPTOP (MISCELLANEOUS) ×1 IMPLANT
SUT VIC AB 4-0 PS2 27 (SUTURE) ×3 IMPLANT
SWAB CULTURE ESWAB REG 1ML (MISCELLANEOUS) IMPLANT
SYR CONTROL 10ML LL (SYRINGE) ×1 IMPLANT
TOWEL GREEN STERILE FF (TOWEL DISPOSABLE) ×2 IMPLANT
TRAY DSU PREP LF (CUSTOM PROCEDURE TRAY) ×1 IMPLANT
UNDERPAD 30X36 HEAVY ABSORB (UNDERPADS AND DIAPERS) ×1 IMPLANT

## 2021-07-09 NOTE — Transfer of Care (Signed)
Immediate Anesthesia Transfer of Care Note ? ?Patient: Amy Madden ? ?Procedure(s) Performed: RIGHT LEG DEBRIDEMENT WITH MYRIAD PLACEMNT (Right: Ankle) ? ?Patient Location: PACU ? ?Anesthesia Type:General ? ?Level of Consciousness: awake, alert  and oriented ? ?Airway & Oxygen Therapy: Patient Spontanous Breathing and Patient connected to face mask oxygen ? ?Post-op Assessment: Report given to RN and Post -op Vital signs reviewed and stable ? ?Post vital signs: Reviewed and stable ? ?Last Vitals:  ?BP: 137/84 (98) ?Vitals Value Taken Time  ?BP    ?Temp    ?Pulse 87 07/09/21 1457  ?Resp 12 07/09/21 1457  ?SpO2 100 % 07/09/21 1457  ?Vitals shown include unvalidated device data. ? ?Last Pain:  ?Vitals:  ? 07/09/21 1153  ?TempSrc: Oral  ?PainSc: 7   ?   ? ?Patients Stated Pain Goal: 7 (07/09/21 1153) ? ?Complications: No notable events documented. ?

## 2021-07-09 NOTE — Anesthesia Preprocedure Evaluation (Signed)
Anesthesia Evaluation  ?Patient identified by MRN, date of birth, ID band ?Patient awake ? ? ? ?Reviewed: ?Allergy & Precautions, NPO status , Patient's Chart, lab work & pertinent test results ? ?Airway ?Mallampati: II ? ?TM Distance: >3 FB ?Neck ROM: Full ? ? ? Dental ?no notable dental hx. ? ?  ?Pulmonary ?Current Smoker,  ?  ?Pulmonary exam normal ?breath sounds clear to auscultation ? ? ? ? ? ? Cardiovascular ?negative cardio ROS ?Normal cardiovascular exam ?Rhythm:Regular Rate:Normal ? ? ?  ?Neuro/Psych ?negative neurological ROS ? negative psych ROS  ? GI/Hepatic ?negative GI ROS, Neg liver ROS,   ?Endo/Other  ?negative endocrine ROS ? Renal/GU ?negative Renal ROS  ?negative genitourinary ?  ?Musculoskeletal ?negative musculoskeletal ROS ?(+)  ? Abdominal ?  ?Peds ?negative pediatric ROS ?(+)  Hematology ?negative hematology ROS ?(+)   ?Anesthesia Other Findings ? ? Reproductive/Obstetrics ?negative OB ROS ? ?  ? ? ? ? ? ? ? ? ? ? ? ? ? ?  ?  ? ? ? ? ? ? ? ? ?Anesthesia Physical ?Anesthesia Plan ? ?ASA: 2 ? ?Anesthesia Plan: General  ? ?Post-op Pain Management: Minimal or no pain anticipated  ? ?Induction: Intravenous ? ?PONV Risk Score and Plan: 2 and Ondansetron, Dexamethasone and Treatment may vary due to age or medical condition ? ?Airway Management Planned: LMA ? ?Additional Equipment:  ? ?Intra-op Plan:  ? ?Post-operative Plan: Extubation in OR ? ?Informed Consent: I have reviewed the patients History and Physical, chart, labs and discussed the procedure including the risks, benefits and alternatives for the proposed anesthesia with the patient or authorized representative who has indicated his/her understanding and acceptance.  ? ? ? ?Dental advisory given ? ?Plan Discussed with: CRNA and Surgeon ? ?Anesthesia Plan Comments:   ? ? ? ? ? ? ?Anesthesia Quick Evaluation ? ?

## 2021-07-09 NOTE — Op Note (Signed)
Operative Note  ? ?DATE OF OPERATION: 07/09/2021 ? ?SURGICAL DEPARTMENT: Plastic Surgery ? ?PREOPERATIVE DIAGNOSES:  Open wound right leg ? ?POSTOPERATIVE DIAGNOSES:  same ? ?PROCEDURE:   ?1)  Debridement of right open leg wound, preparation for skin graft 25x10 cm ?2)  Placement of skin graft substitute 25x10 cm right leg ? ? ?SURGEON: Myrth Dahan P. Gabi Mcfate, MD ? ?ASSISTANT: Sharyn Lull, PA ? ?ANESTHESIA:  General.  ? ?COMPLICATIONS: None.  ? ?INDICATIONS FOR PROCEDURE:  ?The patient, Amy Madden is a 41 y.o. female born on [birth year], is here for treatment of open wound right leg ?MRN: [id number] ? ?CONSENT:  ?Informed consent was obtained directly from the patient. Risks, benefits and alternatives were fully discussed. Specific risks including but not limited to bleeding, infection, hematoma, seroma, scarring, pain, contracture, asymmetry, wound healing problems, and need for further surgery were all discussed. The patient did have an ample opportunity to have questions answered to satisfaction.  ? ?DESCRIPTION OF PROCEDURE:  ?The patient was taken to the operating room. SCDs were placed and preoperative antibiotics were given. General anesthesia was administered.  The patient's operative site was prepped and draped in a sterile fashion. A time out was performed and all information was confirmed to be correct.   ? ?Right leg was then scrubbed with a chlorhexidine scrub brush.  Additional debridement was with guaze and curette.  Hemostasis was confirmed.  A layer of Myriad was sutured in place with 4-0 Vicryl.  Sorbac was then sutured over this.  Dry guaze followed by cast padding and ACE wrap.   ? ?The PA Sharyn Lull was necessary as a surgical assistant for retraction, handling the graft, and positing. ? ?The patient tolerated the procedure well.  There were no complications. The patient was allowed to wake from anesthesia, extubated and taken to the recovery room in satisfactory condition.   ?

## 2021-07-09 NOTE — Addendum Note (Signed)
Addended by: Wallace Going on: 07/09/2021 01:44 PM ? ? Modules accepted: Orders ? ?

## 2021-07-09 NOTE — Interval H&P Note (Signed)
History and Physical Interval Note: ? ?07/09/2021 ?1:11 PM ? ?Amy Madden  has presented today for surgery, with the diagnosis of CLOSED AVULSION FRACTURE OF RIGHT ANKLE.  The various methods of treatment have been discussed with the patient and family. After consideration of risks, benefits and other options for treatment, the patient has consented to  Procedure(s): ?RIGHT LEG DEBRIDEMENT WITH MYRIAD PLACEMNT (Right) as a surgical intervention.  The patient's history has been reviewed, patient examined, no change in status, stable for surgery.  I have reviewed the patient's chart and labs.  Questions were answered to the patient's satisfaction.   ? ? ?Lennice Sites ? ? ?

## 2021-07-09 NOTE — Discharge Instructions (Addendum)
Activity As tolerated: Do not get your leg wet. ?NO driving ?No heavy activities ? ?Diet: Regular ? ?Wound Care: Keep dressing clean & dry. Do not change dressing. ? ?Special Instructions: ?Call Doctor if any unusual problems occur such as pain, excessive ?Bleeding, unrelieved Nausea/vomiting, Fever &/or chills ? ?Follow-up appointment: Scheduled for next week 07/14/21 at 9am at [address], [address] [place] [address] [facility]) ? ?WEIGHT BEARING PER ORTHOPEDIC DOCTOR RECOMMENDATIONS.  ? ? ? ?Post Anesthesia Home Care Instructions ? ?Activity: ?Get plenty of rest for the remainder of the day. A responsible individual must stay with you for 24 hours following the procedure.  ?For the next 24 hours, DO NOT: ?-Drive a car ?-Advertising copywriter ?-Drink alcoholic beverages ?-Take any medication unless instructed by your physician ?-Make any legal decisions or sign important papers. ? ?Meals: ?Start with liquid foods such as gelatin or soup. Progress to regular foods as tolerated. Avoid greasy, spicy, heavy foods. If nausea and/or vomiting occur, drink only clear liquids until the nausea and/or vomiting subsides. Call your physician if vomiting continues. ? ?Special Instructions/Symptoms: ?Your throat may feel dry or sore from the anesthesia or the breathing tube placed in your throat during surgery. If this causes discomfort, gargle with warm salt water. The discomfort should disappear within 24 hours. ? ?    ?

## 2021-07-09 NOTE — Anesthesia Procedure Notes (Signed)
Procedure Name: LMA Insertion ?Date/Time: 07/09/2021 2:11 PM ?Performed by: Lauralyn Primes, CRNA ?Pre-anesthesia Checklist: Patient identified, Emergency Drugs available, Suction available and Patient being monitored ?Patient Re-evaluated:Patient Re-evaluated prior to induction ?Oxygen Delivery Method: Circle system utilized ?Preoxygenation: Pre-oxygenation with 100% oxygen ?Induction Type: IV induction ?Ventilation: Mask ventilation without difficulty ?LMA: LMA inserted ?LMA Size: 4.0 ?Number of attempts: 1 ?Airway Equipment and Method: Bite block ?Placement Confirmation: positive ETCO2 ?Tube secured with: Tape ?Dental Injury: Teeth and Oropharynx as per pre-operative assessment  ? ? ? ? ?

## 2021-07-10 ENCOUNTER — Encounter (HOSPITAL_BASED_OUTPATIENT_CLINIC_OR_DEPARTMENT_OTHER): Payer: Self-pay | Admitting: Plastic Surgery

## 2021-07-11 NOTE — Anesthesia Postprocedure Evaluation (Signed)
Anesthesia Post Note ? ?Patient: Amy Madden ? ?Procedure(s) Performed: RIGHT LEG DEBRIDEMENT WITH MYRIAD PLACEMNT (Right: Ankle) ? ?  ? ?Patient location during evaluation: PACU ?Anesthesia Type: General ?Level of consciousness: awake and alert ?Pain management: pain level controlled ?Vital Signs Assessment: post-procedure vital signs reviewed and stable ?Respiratory status: spontaneous breathing, nonlabored ventilation, respiratory function stable and patient connected to nasal cannula oxygen ?Cardiovascular status: blood pressure returned to baseline and stable ?Postop Assessment: no apparent nausea or vomiting ?Anesthetic complications: no ? ? ?No notable events documented. ? ?Last Vitals:  ?Vitals:  ? 07/09/21 1540 07/09/21 1601  ?BP:  (!) 145/74  ?Pulse: 77 80  ?Resp: 12 16  ?Temp:  36.7 ?C  ?SpO2: 91% 98%  ?  ?Last Pain:  ?Vitals:  ? 07/10/21 1036  ?TempSrc:   ?PainSc: 0-No pain  ? ? ?  ?  ?  ?  ?  ?  ? ?Rayquan Amrhein S ? ? ? ? ?

## 2021-07-14 ENCOUNTER — Ambulatory Visit (INDEPENDENT_AMBULATORY_CARE_PROVIDER_SITE_OTHER): Payer: Self-pay | Admitting: Surgical

## 2021-07-14 DIAGNOSIS — S81801A Unspecified open wound, right lower leg, initial encounter: Secondary | ICD-10-CM

## 2021-07-14 DIAGNOSIS — S82891A Other fracture of right lower leg, initial encounter for closed fracture: Secondary | ICD-10-CM

## 2021-07-14 NOTE — Progress Notes (Signed)
Patient is a 41 year old female here for follow-up on her right leg wound.  She underwent debridement, application of myriad wound matrix with Dr. Erin Hearing on 07/09/2021.  She presents today with her children.  She reports that she is overall doing well, having some burning sensation in the area and some swelling but otherwise feels well. ? ?She denies any fevers. ? ?On exam of the right lower extremity,There is no erythema or cellulitic changes.  No foul odor is noted.  Sorbact in place.  Palpable DP pulses noted.  She is able to move all 5 toes.  Compartments soft, no tenderness with palpation.  Myriad wound matrix is in place, slightly desiccated at the edges. ? ?Recommend K-Y jelly dressing changes daily or every other day.  We discussed changing daily if it continues to be desiccated, can change every other day once the area is moistened and less dry.  Provided patient with list of wound care supplies to purchase. ?There is no signs of infection on exam.  Recommend ibuprofen and Tylenol for pain. ?Ambulation status per Ortho. ?

## 2021-07-16 NOTE — Telephone Encounter (Signed)
Signed by Peggye Form, DO on 07/07/21 at 10:46 ?

## 2021-07-18 ENCOUNTER — Encounter: Payer: Self-pay | Admitting: Emergency Medicine

## 2021-07-18 ENCOUNTER — Emergency Department
Admission: EM | Admit: 2021-07-18 | Discharge: 2021-07-19 | Disposition: A | Discharge status: Home / Self Care | Payer: Medicaid - Out of State | Attending: Emergency Medicine | Admitting: Emergency Medicine

## 2021-07-18 ENCOUNTER — Emergency Department: Payer: Medicaid - Out of State

## 2021-07-18 ENCOUNTER — Other Ambulatory Visit: Payer: Self-pay

## 2021-07-18 DIAGNOSIS — M79604 Pain in right leg: Secondary | ICD-10-CM | POA: Diagnosis not present

## 2021-07-18 DIAGNOSIS — S8011XD Contusion of right lower leg, subsequent encounter: Secondary | ICD-10-CM | POA: Diagnosis not present

## 2021-07-18 DIAGNOSIS — S90121D Contusion of right lesser toe(s) without damage to nail, subsequent encounter: Secondary | ICD-10-CM | POA: Insufficient documentation

## 2021-07-18 DIAGNOSIS — M25571 Pain in right ankle and joints of right foot: Secondary | ICD-10-CM | POA: Diagnosis not present

## 2021-07-18 DIAGNOSIS — R200 Anesthesia of skin: Secondary | ICD-10-CM | POA: Diagnosis not present

## 2021-07-18 LAB — CBC
HCT: 40.8 % (ref 36.0–46.0)
Hemoglobin: 13.2 g/dL (ref 12.0–15.0)
MCH: 28.4 pg (ref 26.0–34.0)
MCHC: 32.4 g/dL (ref 30.0–36.0)
MCV: 87.9 fL (ref 80.0–100.0)
Platelets: 247 10*3/uL (ref 150–400)
RBC: 4.64 MIL/uL (ref 3.87–5.11)
RDW: 13.2 % (ref 11.5–15.5)
WBC: 6 10*3/uL (ref 4.0–10.5)
nRBC: 0 % (ref 0.0–0.2)

## 2021-07-18 LAB — COMPREHENSIVE METABOLIC PANEL
ALT: 10 U/L (ref 0–44)
AST: 14 U/L — ABNORMAL LOW (ref 15–41)
Albumin: 4 g/dL (ref 3.5–5.0)
Alkaline Phosphatase: 60 U/L (ref 38–126)
Anion gap: 7 (ref 5–15)
BUN: 7 mg/dL (ref 6–20)
CO2: 27 mmol/L (ref 22–32)
Calcium: 9.3 mg/dL (ref 8.9–10.3)
Chloride: 103 mmol/L (ref 98–111)
Creatinine, Ser: 0.76 mg/dL (ref 0.44–1.00)
GFR, Estimated: >60 mL/min (ref >60)
Glucose, Bld: 107 mg/dL — ABNORMAL HIGH (ref 70–99)
Potassium: 3.8 mmol/L (ref 3.5–5.1)
Sodium: 137 mmol/L (ref 135–145)
Total Bilirubin: 0.4 mg/dL (ref 0.3–1.2)
Total Protein: 7.6 g/dL (ref 6.5–8.1)

## 2021-07-18 IMAGING — CR DG ANKLE 2V *R*
1 series · 2 of 2 positions shown · non-contrast
Comparison: X-ray right ankle [DATE]

CLINICAL DATA: pain

EXAM:
RIGHT ANKLE - 2 VIEW

[Series 1: dg ankle 2 views right · 0.14mm/px · 2 of 2 slices shown]
[im 1/2]
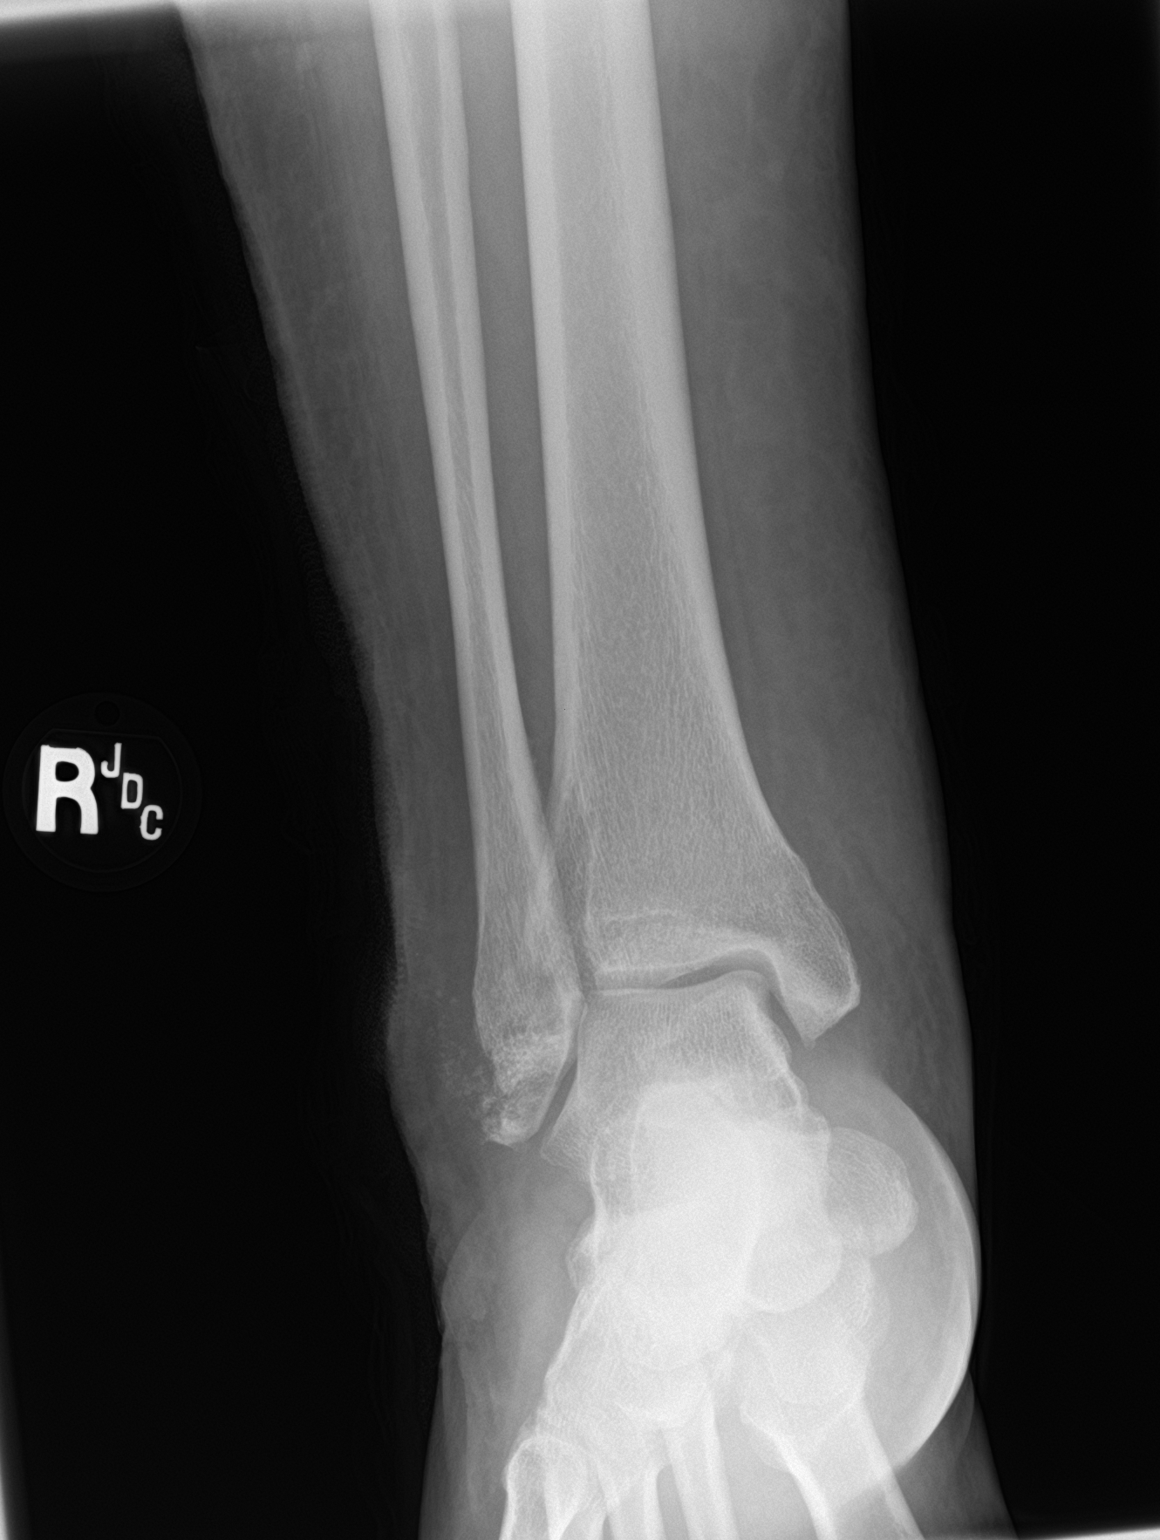
[im 2/2]
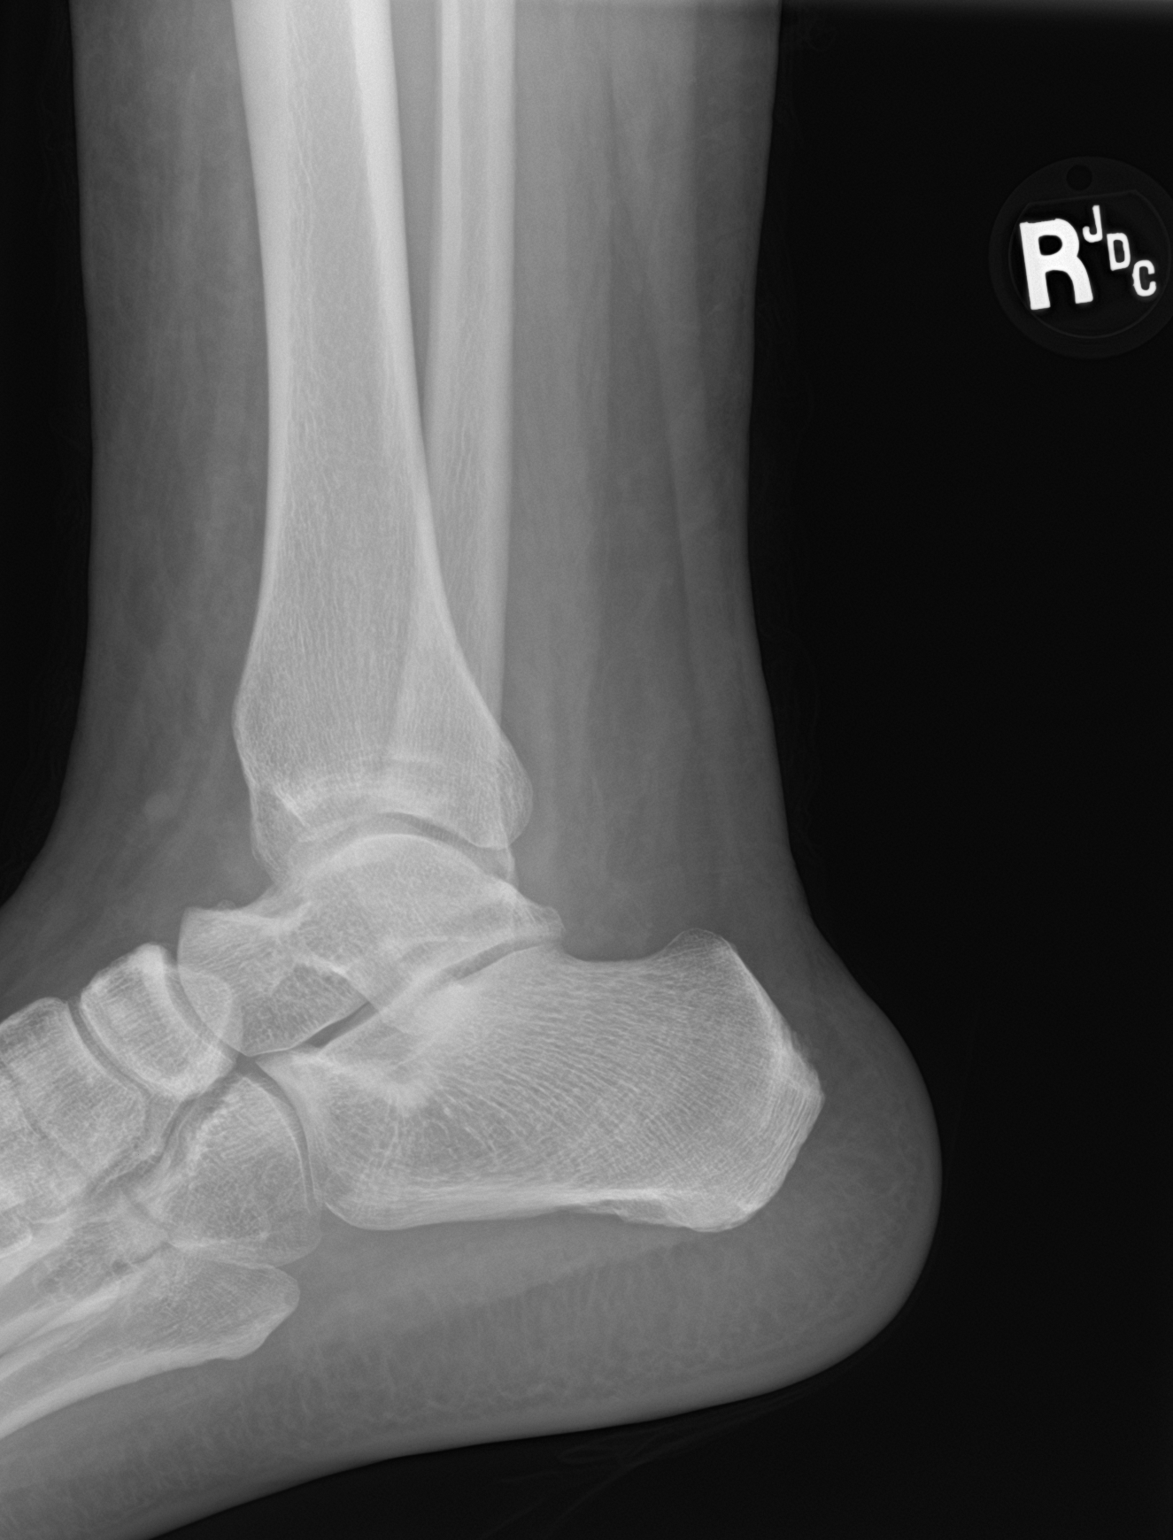

[2 of 2 positions shown; findings below may reference images not displayed]

FINDINGS: Limited evaluation due to overlapping osseous structures and
overlying soft tissues.

Redemonstration of a comminuted infra syndesmotic distal fibular
fracture. Query developing trace callus formation. No definite
cortical erosion or destruction with limited evaluation due to
multiple fracture fragment associated with the distal fibula. There
is no evidence of fracture, dislocation, or joint effusion. There is
no evidence of arthropathy or aggressive appearing focal bone
abnormality. Overlying subcutaneus soft tissue edema.
IMPRESSION: Redemonstration of a comminuted infra-syndesmotic distal fibular
fracture (likely considered an open fracture previously on
[DATE]). Query developing trace callus formation. If concern for
osteomyelitis, please consider MRI for further evaluation (with
contrast if GFR greater than 30).
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
## 2021-07-18 NOTE — ED Notes (Addendum)
Joseph Art, PA-C saw pt in triage room, pt has discoloration to right foot. Wound to right ankle.  ?

## 2021-07-19 ENCOUNTER — Emergency Department: Payer: Medicaid - Out of State

## 2021-07-19 ENCOUNTER — Encounter: Payer: Self-pay | Admitting: Radiology

## 2021-07-19 LAB — PROCALCITONIN: Procalcitonin: <0.1 ng/mL
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
## 2021-07-19 IMAGING — US US EXTREM LOW VENOUS*R*
1 series · 14 of 24 positions shown · non-contrast
Comparison: None.

CLINICAL DATA: Right leg pain

EXAM:
RIGHT LOWER EXTREMITY VENOUS DOPPLER ULTRASOUND
TECHNIQUE: Gray-scale sonography with compression, as well as color and duplex
ultrasound, were performed to evaluate the deep venous system(s)
from the level of the common femoral vein through the popliteal and
proximal calf veins.

[Series 1: us venous img lower uni right (dvt) · portal-venous · 64 acquisitions, 14 frames shown]
[im 1/64]
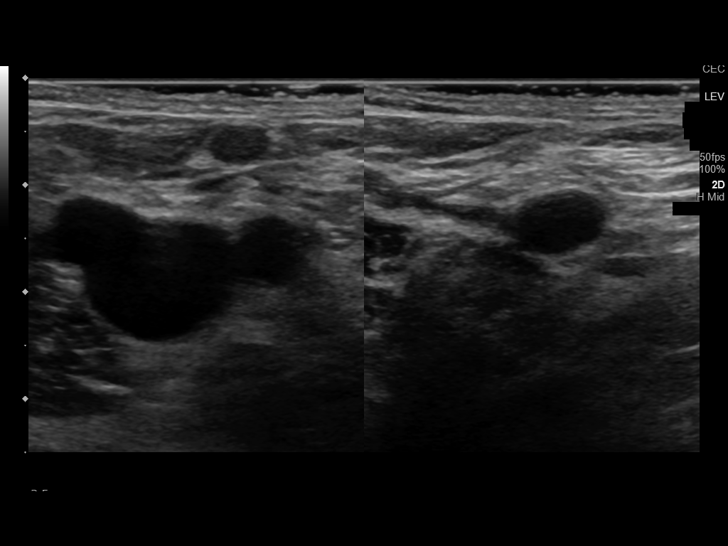
[im 6/64]
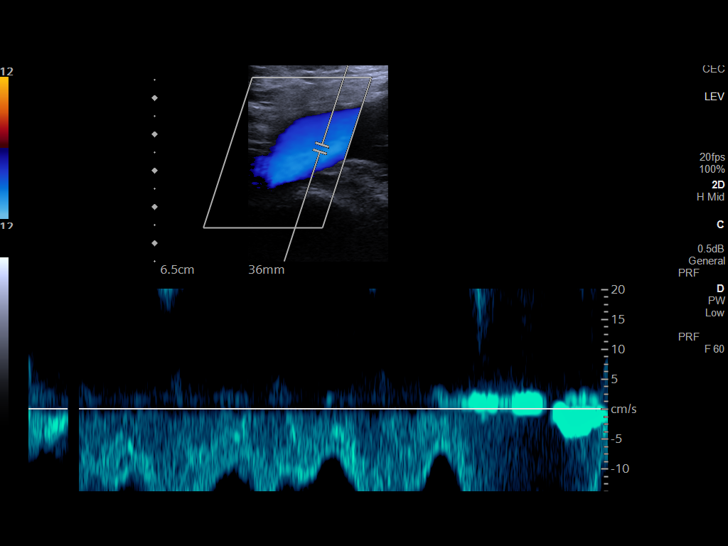
[im 11/64]
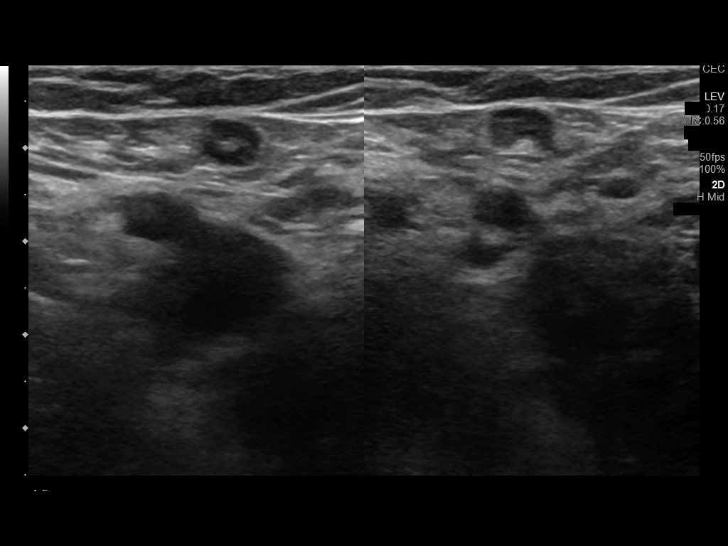
[im 20/64]
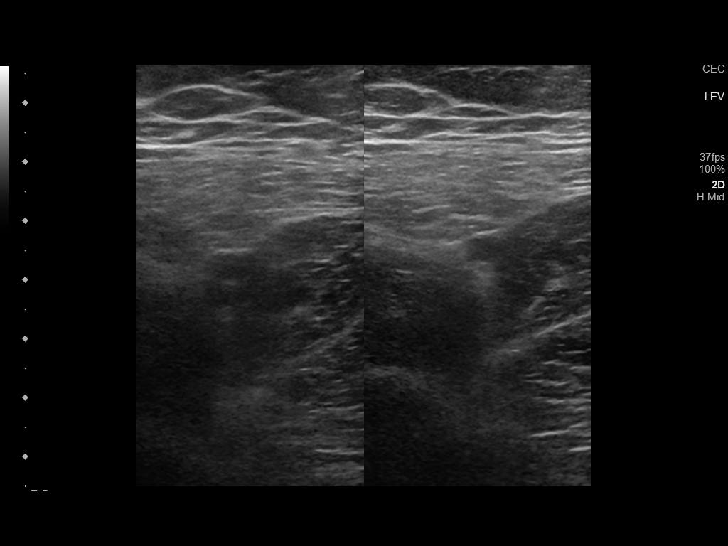
[im 22/64]
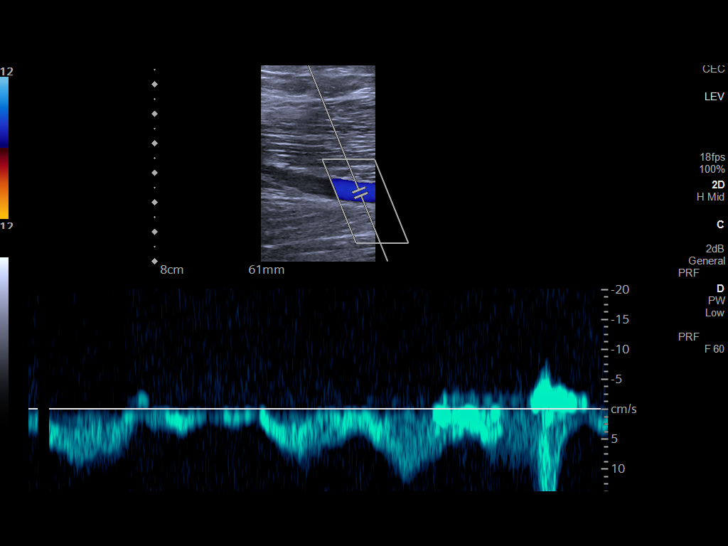
[im 28/64]
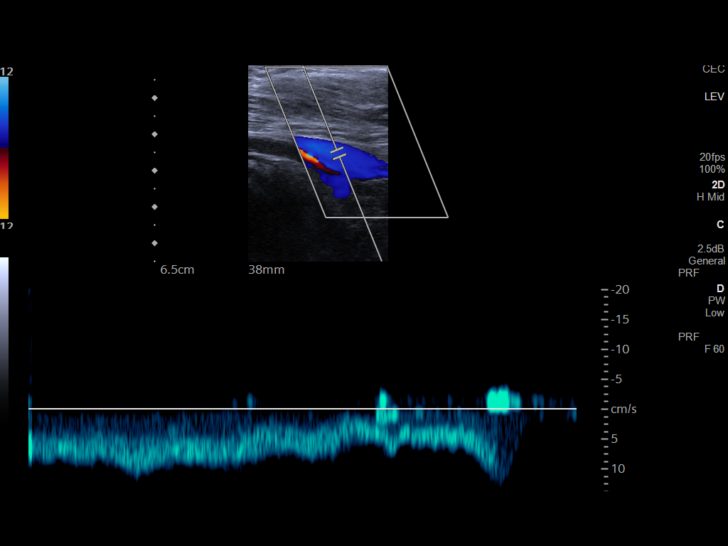
[im 33/64]
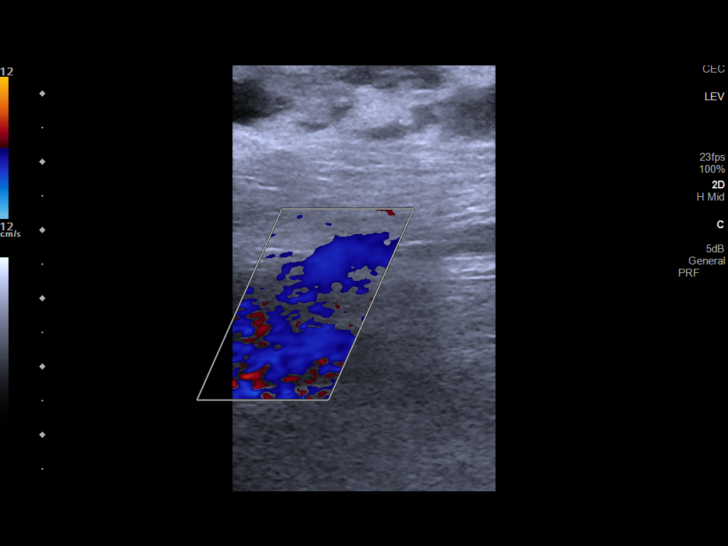
[im 36/64]
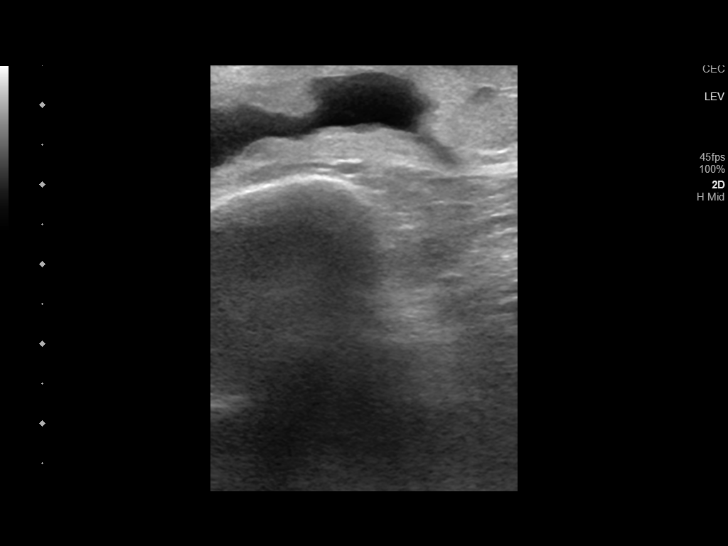
[im 42/64]
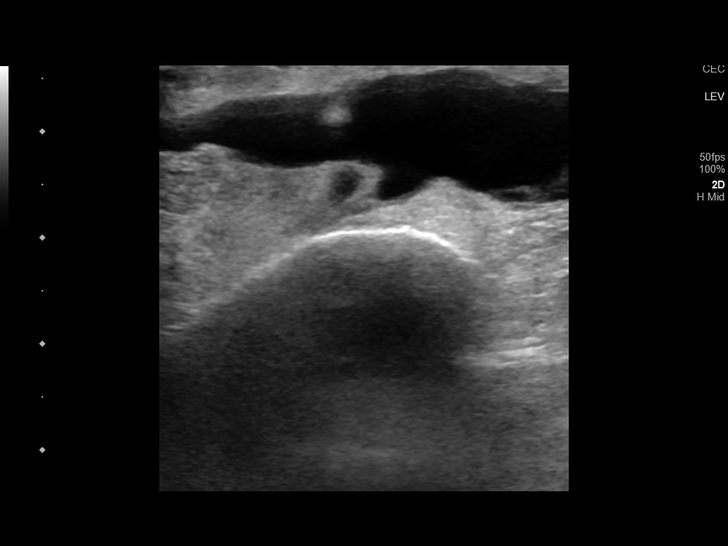
[im 47/64]
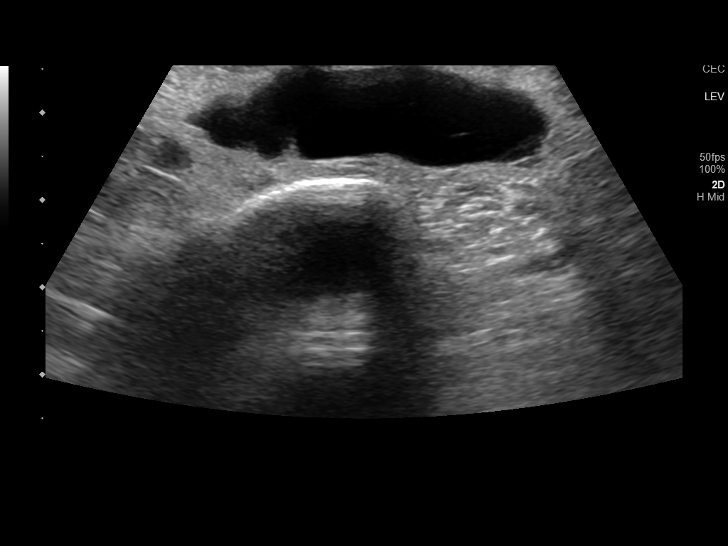
[im 55/64]
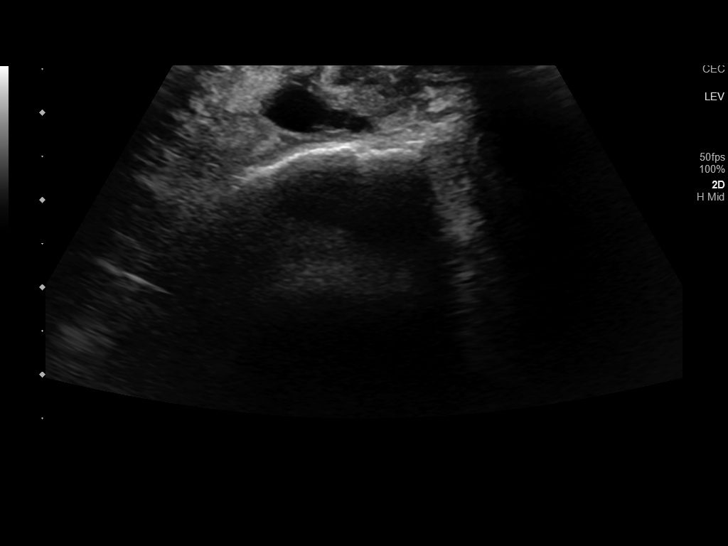
[im 58/64]
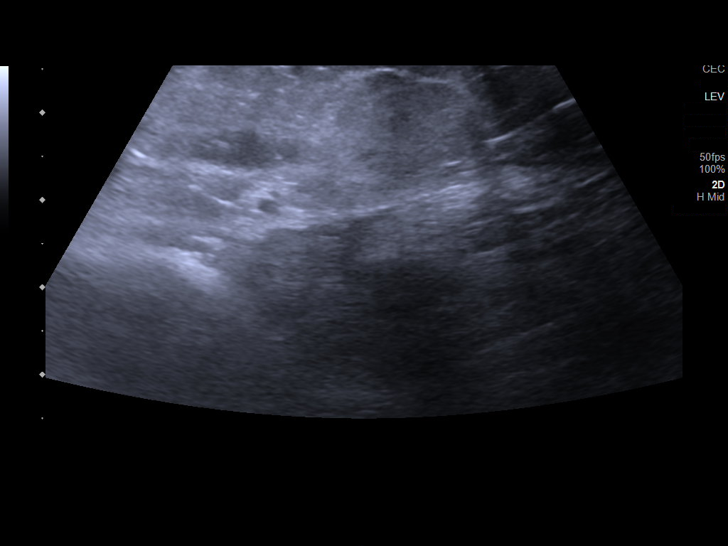
[im 61/64]
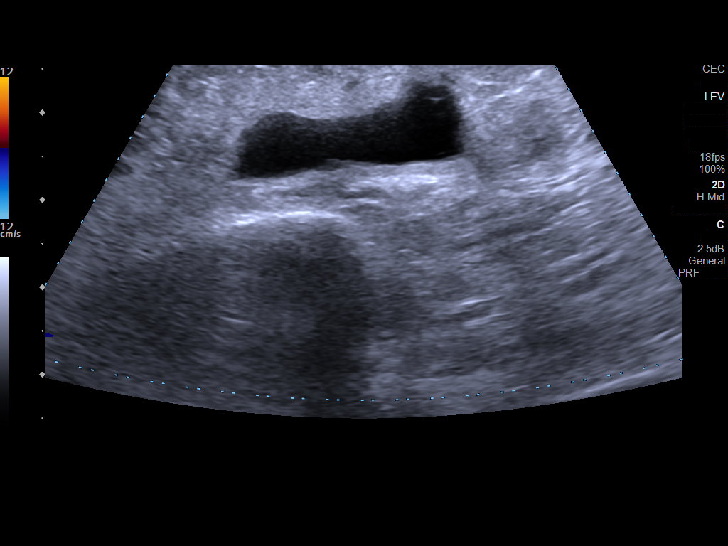
[im 64/64]
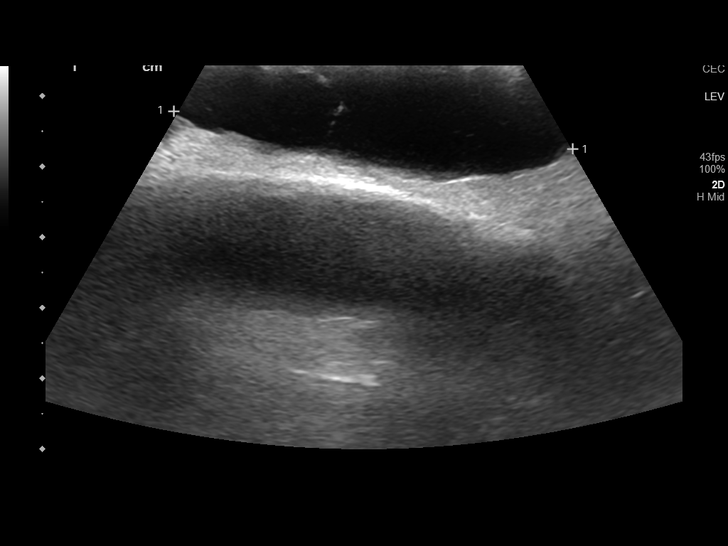

[14 of 24 positions shown; findings below may reference images not displayed]

FINDINGS: VENOUS

Normal compressibility of the common femoral, superficial femoral,
and popliteal veins, as well as the visualized calf veins.
Visualized portions of profunda femoral vein and great saphenous
vein unremarkable. No filling defects to suggest DVT on grayscale or
color Doppler imaging. Doppler waveforms show normal direction of
venous flow, normal respiratory plasticity and response to
augmentation.

Limited views of the contralateral common femoral vein are
unremarkable.

OTHER

Fluid seen in the right calf from the mid calf to the ankle.

Limitations: none
IMPRESSION: No evidence of right lower extremity DVT.
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
## 2021-07-19 MED ORDER — GADOBUTROL 1 MMOL/ML IV SOLN
10.0000 mL | Freq: Once | INTRAVENOUS | Status: AC | PRN
  Administered 2021-07-19: 10 mL via INTRAVENOUS

## 2021-07-19 MED ORDER — SODIUM CHLORIDE 0.9 % IV SOLN: INTRAVENOUS | Status: DC

## 2021-07-19 MED ORDER — HYDROMORPHONE HCL 1 MG/ML IJ SOLN
1.0000 mg | Freq: Once | INTRAMUSCULAR | Status: AC
  Administered 2021-07-19: 1 mg via INTRAVENOUS
  Filled 2021-07-19: qty 1

## 2021-07-19 MED ORDER — OXYCODONE-ACETAMINOPHEN 5-325 MG PO TABS: 2.0000 | ORAL_TABLET | Freq: Four times a day (QID) | ORAL | 0 refills | Status: DC | PRN

## 2021-07-19 NOTE — ED Notes (Signed)
Dressing on right lower lateral leg re-dressed at this time.  Pt ambulatory to lobby at this time with crutches.  Pt A&O 4 at this time in NAD at discharge.   ?

## 2021-07-19 NOTE — Discharge Instructions (Addendum)
You are being provided a prescription for opiates (also known as narcotics) for pain control.  Opiates can be addictive and should only be used when absolutely necessary for pain control when other alternatives do not work.  We recommend you only use them for the recommended amount of time and only as prescribed.  Please do not take with other sedative medications or alcohol.  Please do not drive, operate machinery, make important decisions while taking opiates.  Please note that these medications can be addictive and have high abuse potential.  Patients can become addicted to narcotics after only taking them for a few days.  Please keep these medications locked away from children, teenagers or any family members with history of substance abuse.  Narcotic pain medicine may also make you constipated.  You may use over-the-counter medications such as MiraLAX, Colace to prevent constipation.  If you become constipated, you may use over-the-counter enemas as needed.  Itching and nausea are also common side effects of narcotic pain medication.  If you develop uncontrolled vomiting or a rash, please stop these medications and seek medical care. ? ? ?Please follow-up with plastic surgery as scheduled on Monday.  You had a small fluid collection seen on your MRI but no other acute abnormality.  This is extremely small area of fluid that I was not able to see with ultrasound to drain.  Discussed your case with Dr. Arita Miss who is on-call for Dr. Ulice Bold.  Given you have no fever, no elevated white blood cell count, normal procalcitonin and no signs of overlying skin infection, it is unlikely that this is an abscess.  I do not feel you need admission or antibiotics at this time but do recommend follow-up with your plastic surgeon as scheduled in the next 48 hours.  I recommend keeping your leg elevated at rest and keeping your wound clean and dry.  You do not have any signs of compartment syndrome today. ?
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
## 2021-07-21 ENCOUNTER — Ambulatory Visit (INDEPENDENT_AMBULATORY_CARE_PROVIDER_SITE_OTHER): Payer: Self-pay | Admitting: Plastic Surgery

## 2021-07-21 DIAGNOSIS — S81801A Unspecified open wound, right lower leg, initial encounter: Secondary | ICD-10-CM

## 2021-07-22 NOTE — Progress Notes (Signed)
? ?  Referring Provider ?No referring provider defined for this encounter.  ? ?CC:  ?Status post right leg debridement and marrow placement ? ?Amy Madden is an 41 y.o. female.  ?HPI: Patient is a 41 year old status post right leg debridement and myriad placement.  She has been performing wound care at home. ? ?Review of Systems ?General: No fever or chills ? ?Physical Exam ? ?  07/19/2021  ?  6:05 AM 07/19/2021  ?  4:48 AM 07/19/2021  ?  1:15 AM  ?Vitals with BMI  ?Systolic 599 234 144  ?Diastolic 69 360 85  ?Pulse 75 72 76  ?  ?General:  No acute distress,  Alert and oriented, Non-Toxic, Normal speech and affect ?Extremity: 11 x 5 cm wound right leg, full-thickness with granulation tissue and some areas.  More superiorly it there partial-thickness and healing well ? ?Assessment/Plan ?We met with full-thickness graft indicated. ? ?Lennice Sites ?07/22/2021, 10:54 PM  ? ? ?    ?

## 2021-07-22 NOTE — H&P (View-Only) (Signed)
? ?  Referring Provider ?No referring provider defined for this encounter.  ? ?CC:  ?Status post right leg debridement and marrow placement ? ?Amy Madden is an 41 y.o. female.  ?HPI: Patient is a 39-year-old status post right leg debridement and myriad placement.  She has been performing wound care at home. ? ?Review of Systems ?General: No fever or chills ? ?Physical Exam ? ?  07/19/2021  ?  6:05 AM 07/19/2021  ?  4:48 AM 07/19/2021  ?  1:15 AM  ?Vitals with BMI  ?Systolic 117 119 128  ?Diastolic 69 107 85  ?Pulse 75 72 76  ?  ?General:  No acute distress,  Alert and oriented, Non-Toxic, Normal speech and affect ?Extremity: 11 x 5 cm wound right leg, full-thickness with granulation tissue and some areas.  More superiorly it there partial-thickness and healing well ? ?Assessment/Plan ?We met with full-thickness graft indicated. ? ?Amy Madden ?07/22/2021, 10:54 PM  ? ? ?    ?

## 2021-07-23 ENCOUNTER — Other Ambulatory Visit: Payer: Self-pay

## 2021-07-23 ENCOUNTER — Encounter (HOSPITAL_BASED_OUTPATIENT_CLINIC_OR_DEPARTMENT_OTHER): Payer: Self-pay | Admitting: Plastic Surgery

## 2021-07-29 ENCOUNTER — Ambulatory Visit (HOSPITAL_BASED_OUTPATIENT_CLINIC_OR_DEPARTMENT_OTHER): Payer: Medicaid - Out of State | Admitting: Anesthesiology

## 2021-07-29 ENCOUNTER — Encounter (HOSPITAL_BASED_OUTPATIENT_CLINIC_OR_DEPARTMENT_OTHER)
Admission: RE | Disposition: A | Discharge status: Home / Self Care | Payer: Self-pay | Source: Home / Self Care | Attending: Plastic Surgery

## 2021-07-29 ENCOUNTER — Other Ambulatory Visit: Payer: Self-pay

## 2021-07-29 ENCOUNTER — Encounter (HOSPITAL_BASED_OUTPATIENT_CLINIC_OR_DEPARTMENT_OTHER): Payer: Self-pay | Admitting: Plastic Surgery

## 2021-07-29 ENCOUNTER — Ambulatory Visit (HOSPITAL_BASED_OUTPATIENT_CLINIC_OR_DEPARTMENT_OTHER)
Admission: RE | Admit: 2021-07-29 | Discharge: 2021-07-29 | Disposition: A | Discharge status: Home / Self Care | Payer: Medicaid - Out of State | Attending: Plastic Surgery | Admitting: Plastic Surgery

## 2021-07-29 DIAGNOSIS — S81801A Unspecified open wound, right lower leg, initial encounter: Secondary | ICD-10-CM

## 2021-07-29 DIAGNOSIS — F17200 Nicotine dependence, unspecified, uncomplicated: Secondary | ICD-10-CM | POA: Insufficient documentation

## 2021-07-29 DIAGNOSIS — X58XXXA Exposure to other specified factors, initial encounter: Secondary | ICD-10-CM | POA: Diagnosis not present

## 2021-07-29 HISTORY — PX: IRRIGATION AND DEBRIDEMENT OF WOUND WITH SPLIT THICKNESS SKIN GRAFT: SHX5879

## 2021-07-29 LAB — POCT PREGNANCY, URINE: Preg Test, Ur: NEGATIVE

## 2021-07-29 SURGERY — IRRIGATION AND DEBRIDEMENT OF WOUND WITH SPLIT THICKNESS SKIN GRAFT
Anesthesia: General | Site: Leg Lower | Laterality: Right

## 2021-07-29 MED ORDER — HYDROMORPHONE HCL 1 MG/ML IJ SOLN
INTRAMUSCULAR | Status: AC
  Filled 2021-07-29: qty 0.5

## 2021-07-29 MED ORDER — ONDANSETRON HCL 4 MG/2ML IJ SOLN
INTRAMUSCULAR | Status: AC
  Filled 2021-07-29: qty 2

## 2021-07-29 MED ORDER — PROPOFOL 10 MG/ML IV BOLUS
INTRAVENOUS | Status: DC | PRN
  Administered 2021-07-29: 200 mg via INTRAVENOUS

## 2021-07-29 MED ORDER — MINERAL OIL LIGHT 100 % EX OIL
TOPICAL_OIL | CUTANEOUS | Status: DC | PRN
  Administered 2021-07-29 (×3): 1 via TOPICAL

## 2021-07-29 MED ORDER — FENTANYL CITRATE (PF) 100 MCG/2ML IJ SOLN
INTRAMUSCULAR | Status: AC
  Filled 2021-07-29: qty 2

## 2021-07-29 MED ORDER — FENTANYL CITRATE (PF) 100 MCG/2ML IJ SOLN
INTRAMUSCULAR | Status: AC
Start: 2021-07-29 — End: ?
  Filled 2021-07-29: qty 2

## 2021-07-29 MED ORDER — BUPIVACAINE HCL (PF) 0.25 % IJ SOLN
INTRAMUSCULAR | Status: AC
  Filled 2021-07-29: qty 30

## 2021-07-29 MED ORDER — LIDOCAINE HCL (CARDIAC) PF 100 MG/5ML IV SOSY
PREFILLED_SYRINGE | INTRAVENOUS | Status: DC | PRN
Start: 2021-07-29 — End: 2021-07-29
  Administered 2021-07-29: 60 mg via INTRAVENOUS

## 2021-07-29 MED ORDER — DEXAMETHASONE SODIUM PHOSPHATE 10 MG/ML IJ SOLN
INTRAMUSCULAR | Status: DC | PRN
  Administered 2021-07-29: 10 mg via INTRAVENOUS

## 2021-07-29 MED ORDER — OXYCODONE HCL 5 MG/5ML PO SOLN: 5.0000 mg | Freq: Once | ORAL | Status: AC | PRN

## 2021-07-29 MED ORDER — OXYCODONE HCL 5 MG PO TABS: 5.0000 mg | ORAL_TABLET | Freq: Three times a day (TID) | ORAL | 0 refills | Status: AC | PRN

## 2021-07-29 MED ORDER — ATROPINE SULFATE 0.4 MG/ML IV SOLN
INTRAVENOUS | Status: AC
  Filled 2021-07-29: qty 1

## 2021-07-29 MED ORDER — ACETAMINOPHEN 500 MG PO TABS
ORAL_TABLET | ORAL | Status: AC
  Filled 2021-07-29: qty 2

## 2021-07-29 MED ORDER — BACITRACIN ZINC 500 UNIT/GM EX OINT
TOPICAL_OINTMENT | CUTANEOUS | Status: AC
  Filled 2021-07-29: qty 28.35

## 2021-07-29 MED ORDER — KETOROLAC TROMETHAMINE 30 MG/ML IJ SOLN
30.0000 mg | Freq: Once | INTRAMUSCULAR | Status: AC | PRN
  Administered 2021-07-29: 30 mg via INTRAVENOUS

## 2021-07-29 MED ORDER — HYDROMORPHONE HCL 1 MG/ML IJ SOLN
0.5000 mg | INTRAMUSCULAR | Status: DC | PRN
  Administered 2021-07-29 (×2): 0.5 mg via INTRAVENOUS

## 2021-07-29 MED ORDER — PROPOFOL 10 MG/ML IV BOLUS
INTRAVENOUS | Status: AC
  Filled 2021-07-29: qty 20

## 2021-07-29 MED ORDER — OXYCODONE HCL 5 MG PO TABS
5.0000 mg | ORAL_TABLET | Freq: Once | ORAL | Status: AC | PRN
  Administered 2021-07-29: 5 mg via ORAL

## 2021-07-29 MED ORDER — BUPIVACAINE HCL (PF) 0.5 % IJ SOLN
INTRAMUSCULAR | Status: AC
  Filled 2021-07-29: qty 30

## 2021-07-29 MED ORDER — MIDAZOLAM HCL 2 MG/2ML IJ SOLN
INTRAMUSCULAR | Status: AC
  Filled 2021-07-29: qty 2

## 2021-07-29 MED ORDER — 0.9 % SODIUM CHLORIDE (POUR BTL) OPTIME
TOPICAL | Status: DC | PRN
  Administered 2021-07-29: 100 mL

## 2021-07-29 MED ORDER — KETOROLAC TROMETHAMINE 30 MG/ML IJ SOLN
INTRAMUSCULAR | Status: AC
  Filled 2021-07-29: qty 1

## 2021-07-29 MED ORDER — CEFAZOLIN SODIUM 1 G IJ SOLR
INTRAMUSCULAR | Status: AC
  Filled 2021-07-29: qty 40

## 2021-07-29 MED ORDER — ACETAMINOPHEN 500 MG PO TABS
1000.0000 mg | ORAL_TABLET | Freq: Once | ORAL | Status: AC
Start: 2021-07-29 — End: 2021-07-29
  Administered 2021-07-29: 1000 mg via ORAL

## 2021-07-29 MED ORDER — MIDAZOLAM HCL 5 MG/5ML IJ SOLN
INTRAMUSCULAR | Status: DC | PRN
  Administered 2021-07-29: 2 mg via INTRAVENOUS

## 2021-07-29 MED ORDER — LACTATED RINGERS IV SOLN: INTRAVENOUS | Status: DC

## 2021-07-29 MED ORDER — LIDOCAINE 2% (20 MG/ML) 5 ML SYRINGE
INTRAMUSCULAR | Status: AC
  Filled 2021-07-29: qty 5

## 2021-07-29 MED ORDER — ONDANSETRON HCL 4 MG/2ML IJ SOLN
INTRAMUSCULAR | Status: DC | PRN
  Administered 2021-07-29: 4 mg via INTRAVENOUS

## 2021-07-29 MED ORDER — MINERAL OIL LIGHT OIL
TOPICAL_OIL | Status: AC
  Filled 2021-07-29: qty 10

## 2021-07-29 MED ORDER — BUPIVACAINE-EPINEPHRINE (PF) 0.25% -1:200000 IJ SOLN
INTRAMUSCULAR | Status: AC
  Filled 2021-07-29: qty 30

## 2021-07-29 MED ORDER — OXYCODONE HCL 5 MG PO TABS
ORAL_TABLET | ORAL | Status: AC
  Filled 2021-07-29: qty 1

## 2021-07-29 MED ORDER — AMISULPRIDE (ANTIEMETIC) 5 MG/2ML IV SOLN: 10.0000 mg | Freq: Once | INTRAVENOUS | Status: DC | PRN

## 2021-07-29 MED ORDER — CEFAZOLIN SODIUM-DEXTROSE 2-3 GM-%(50ML) IV SOLR
INTRAVENOUS | Status: DC | PRN
  Administered 2021-07-29: 2 g via INTRAVENOUS

## 2021-07-29 MED ORDER — DEXAMETHASONE SODIUM PHOSPHATE 10 MG/ML IJ SOLN
INTRAMUSCULAR | Status: AC
  Filled 2021-07-29: qty 2

## 2021-07-29 MED ORDER — FENTANYL CITRATE (PF) 100 MCG/2ML IJ SOLN
INTRAMUSCULAR | Status: DC | PRN
Start: 2021-07-29 — End: 2021-07-29
  Administered 2021-07-29 (×3): 50 ug via INTRAVENOUS
  Administered 2021-07-29 (×2): 25 ug via INTRAVENOUS

## 2021-07-29 MED ORDER — PHENYLEPHRINE 80 MCG/ML (10ML) SYRINGE FOR IV PUSH (FOR BLOOD PRESSURE SUPPORT)
PREFILLED_SYRINGE | INTRAVENOUS | Status: AC
Start: 2021-07-29 — End: ?
  Filled 2021-07-29: qty 10

## 2021-07-29 MED ORDER — FENTANYL CITRATE (PF) 100 MCG/2ML IJ SOLN
25.0000 ug | INTRAMUSCULAR | Status: DC | PRN
  Administered 2021-07-29 (×3): 50 ug via INTRAVENOUS

## 2021-07-29 SURGICAL SUPPLY — 66 items
ADH SKN CLS APL DERMABOND .7 (GAUZE/BANDAGES/DRESSINGS)
APL SKNCLS STERI-STRIP NONHPOA (GAUZE/BANDAGES/DRESSINGS) ×1
BAG DECANTER FOR FLEXI CONT (MISCELLANEOUS) IMPLANT
BALL CTTN LRG ABS STRL LF (GAUZE/BANDAGES/DRESSINGS) ×3
BENZOIN TINCTURE PRP APPL 2/3 (GAUZE/BANDAGES/DRESSINGS) ×1 IMPLANT
BLADE CLIPPER SURG (BLADE) IMPLANT
BLADE DERMATOME SS (BLADE) ×1 IMPLANT
BLADE HEX COATED 2.75 (ELECTRODE) IMPLANT
BLADE SURG 10 STRL SS (BLADE) IMPLANT
BLADE SURG 15 STRL LF DISP TIS (BLADE) ×1 IMPLANT
BLADE SURG 15 STRL SS (BLADE) ×2
BNDG COHESIVE 4X5 TAN ST LF (GAUZE/BANDAGES/DRESSINGS) IMPLANT
BNDG ELASTIC 4X5.8 VLCR STR LF (GAUZE/BANDAGES/DRESSINGS) IMPLANT
CANISTER SUCT 1200ML W/VALVE (MISCELLANEOUS) IMPLANT
COTTONBALL LRG STERILE PKG (GAUZE/BANDAGES/DRESSINGS) ×5 IMPLANT
COVER BACK TABLE 60X90IN (DRAPES) ×2 IMPLANT
COVER MAYO STAND STRL (DRAPES) ×2 IMPLANT
DEPRESSOR TONGUE BLADE STERILE (MISCELLANEOUS) ×2 IMPLANT
DERMABOND ADVANCED (GAUZE/BANDAGES/DRESSINGS)
DERMABOND ADVANCED .7 DNX12 (GAUZE/BANDAGES/DRESSINGS) IMPLANT
DERMACARRIERS GRAFT 1 TO 1.5 (DISPOSABLE)
DRAPE LAPAROTOMY 100X72 PEDS (DRAPES) IMPLANT
DRAPE U-SHAPE 76X120 STRL (DRAPES) IMPLANT
DRSG CALCIUM ALGINATE 4X4 (GAUZE/BANDAGES/DRESSINGS) IMPLANT
DRSG PAD ABDOMINAL 8X10 ST (GAUZE/BANDAGES/DRESSINGS) IMPLANT
DRSG TEGADERM 4X10 (GAUZE/BANDAGES/DRESSINGS) ×5 IMPLANT
ELECT REM PT RETURN 9FT ADLT (ELECTROSURGICAL)
ELECTRODE REM PT RTRN 9FT ADLT (ELECTROSURGICAL) IMPLANT
GAUZE 4X4 16PLY ~~LOC~~+RFID DBL (SPONGE) IMPLANT
GAUZE SPONGE 4X4 12PLY STRL (GAUZE/BANDAGES/DRESSINGS) ×4 IMPLANT
GAUZE XEROFORM 5X9 LF (GAUZE/BANDAGES/DRESSINGS) ×2 IMPLANT
GLOVE BIOGEL M STRL SZ7.5 (GLOVE) ×2 IMPLANT
GLOVE BIOGEL PI IND STRL 8 (GLOVE) ×1 IMPLANT
GLOVE BIOGEL PI INDICATOR 8 (GLOVE) ×1
GOWN STRL REUS W/ TWL LRG LVL3 (GOWN DISPOSABLE) ×1 IMPLANT
GOWN STRL REUS W/TWL LRG LVL3 (GOWN DISPOSABLE) ×2
GRAFT DERMACARRIERS 1 TO 1.5 (DISPOSABLE) IMPLANT
NDL HYPO 27GX1-1/4 (NEEDLE) ×1 IMPLANT
NEEDLE HYPO 27GX1-1/4 (NEEDLE) ×2 IMPLANT
NS IRRIG 1000ML POUR BTL (IV SOLUTION) ×2 IMPLANT
PACK BASIN DAY SURGERY FS (CUSTOM PROCEDURE TRAY) ×2 IMPLANT
PAD CAST 4YDX4 CTTN HI CHSV (CAST SUPPLIES) IMPLANT
PADDING CAST COTTON 4X4 STRL (CAST SUPPLIES)
PENCIL SMOKE EVACUATOR (MISCELLANEOUS) IMPLANT
SHEET MEDIUM DRAPE 40X70 STRL (DRAPES) IMPLANT
SPIKE FLUID TRANSFER (MISCELLANEOUS) IMPLANT
SPONGE T-LAP 18X18 ~~LOC~~+RFID (SPONGE) ×2 IMPLANT
STAPLER VISISTAT 35W (STAPLE) ×1 IMPLANT
STRIP CLOSURE SKIN 1/2X4 (GAUZE/BANDAGES/DRESSINGS) IMPLANT
SUCTION FRAZIER HANDLE 10FR (MISCELLANEOUS)
SUCTION TUBE FRAZIER 10FR DISP (MISCELLANEOUS) IMPLANT
SUT CHROMIC 5 0 P 3 (SUTURE) IMPLANT
SUT ETHILON 4 0 P 3 18 (SUTURE) IMPLANT
SUT MON AB 4-0 PC3 18 (SUTURE) IMPLANT
SUT MON AB 5-0 PS2 18 (SUTURE) IMPLANT
SUT PROLENE 4 0 P 3 18 (SUTURE) IMPLANT
SUT SILK 3 0 SH CR/8 (SUTURE) ×1 IMPLANT
SUT VIC AB 3-0 FS2 27 (SUTURE) IMPLANT
SUT VICRYL 4-0 PS2 18IN ABS (SUTURE) IMPLANT
SYR BULB EAR ULCER 3OZ GRN STR (SYRINGE) ×2 IMPLANT
SYR CONTROL 10ML LL (SYRINGE) ×2 IMPLANT
TOWEL GREEN STERILE FF (TOWEL DISPOSABLE) ×2 IMPLANT
TRAY DSU PREP LF (CUSTOM PROCEDURE TRAY) ×2 IMPLANT
TUBE CONNECTING 20X1/4 (TUBING) IMPLANT
UNDERPAD 30X36 HEAVY ABSORB (UNDERPADS AND DIAPERS) IMPLANT
YANKAUER SUCT BULB TIP NO VENT (SUCTIONS) IMPLANT

## 2021-07-29 NOTE — Anesthesia Postprocedure Evaluation (Signed)
Anesthesia Post Note ? ?Patient: Amy Madden ? ?Procedure(s) Performed: IRRIGATION AND DEBRIDEMENT OF WOUND WITH SPLIT THICKNESS SKIN GRAFT (Right: Leg Lower) ? ?  ? ?Patient location during evaluation: PACU ?Anesthesia Type: General ?Level of consciousness: awake ?Pain management: pain level controlled ?Vital Signs Assessment: post-procedure vital signs reviewed and stable ?Respiratory status: spontaneous breathing, nonlabored ventilation, respiratory function stable and patient connected to nasal cannula oxygen ?Cardiovascular status: blood pressure returned to baseline and stable ?Postop Assessment: no apparent nausea or vomiting ?Anesthetic complications: no ? ? ?No notable events documented. ? ?Last Vitals:  ?Vitals:  ? 07/29/21 1135 07/29/21 1154  ?BP:  (!) 131/93  ?Pulse: 79 89  ?Resp: 15 18  ?Temp:  36.6 ?C  ?SpO2: 99% 98%  ?  ?Last Pain:  ?Vitals:  ? 07/29/21 1154  ?TempSrc: Oral  ?PainSc: 5   ? ? ?  ?  ?  ?  ?  ?  ? ?Karell Tukes P Natalyia Innes ? ? ? ? ?

## 2021-07-29 NOTE — Op Note (Addendum)
Operative Note   DATE OF OPERATION: 07/29/2021  SURGICAL DEPARTMENT: Plastic Surgery  PREOPERATIVE DIAGNOSES:  right leg wound  POSTOPERATIVE DIAGNOSES:  same  PROCEDURE:   1) debridement preparation for skin graft right leg wound 7x12 cm 2) split thickness skin graft to right leg wound 7x12 cm  SURGEON: Loui Massenburg P. Marnesha Gagen, MD  ASSISTANT: Glenna Fellows, MD  ANESTHESIA:  General.   COMPLICATIONS: None.   INDICATIONS FOR PROCEDURE:  The patient, Amy Madden is a 41 y.o. female born on Dec 10, 1980, is here for treatment of right leg wound MRN: [id number]  CONSENT:  Informed consent was obtained directly from the patient. Risks, benefits and alternatives were fully discussed. Specific risks including but not limited to bleeding, infection, hematoma, seroma, scarring, pain, contracture, asymmetry, wound healing problems, and need for further surgery were all discussed. The patient did have an ample opportunity to have questions answered to satisfaction.   DESCRIPTION OF PROCEDURE:  The patient was taken to the operating room. SCDs were placed and preoperative antibiotics were given. General anesthesia was administered.  The patient's operative site was prepped and draped in a sterile fashion. A time out was performed and all information was confirmed to be correct.    The right leg wound on the lateral ankle adjacent to the lateral malleolus was debrided with a curette.  Hemostasis was confirmed.  The skin graft was harvested at 15 cm from the right lateral thigh.  The graft was stapled in place and then a bolster dressing was sutured over this.  The donor site was dressed with Tegaderm.    The patient tolerated the procedure well.  There were no complications. The patient was allowed to wake from anesthesia, extubated and taken to the recovery room in satisfactory condition.

## 2021-07-29 NOTE — Discharge Instructions (Addendum)
Activity: As tolerated, but avoid strenuous activity until follow up visit. ? ?Diet: Regular ? ?Wound Care: Your skin graft harvest site on right thigh has been dressed with a clear adherent dressing. Do not remove. If it falls off, replace with Vaseline and gauze, secured with Kerlix wrap. You can obtain these items over-the-counter at the pharmacy. As for you lower leg, we placed the skin over the wound site. We then placed a bolster dressing over the top and secured it with stitches.  The lower leg was then wrapped with a white gauze wrap and then an Ace wrap. Do not remove. Keep this area dry. The bolster will be removed at your upcoming post-operative appointment in 6 days. ? ?Special Instructions: ? ?Call our office if any unusual problems occur such as pain, excessive ?bleeding, unrelieved nausea/vomiting, fever &/or chills. ? ?Follow-up appointment: Scheduled for next week. ? ? ?Post Anesthesia Home Care Instructions ? ?Activity: ?Get plenty of rest for the remainder of the day. A responsible individual must stay with you for 24 hours following the procedure.  ?For the next 24 hours, DO NOT: ?-Drive a car ?-Paediatric nurse ?-Drink alcoholic beverages ?-Take any medication unless instructed by your physician ?-Make any legal decisions or sign important papers. ? ?Meals: ?Start with liquid foods such as gelatin or soup. Progress to regular foods as tolerated. Avoid greasy, spicy, heavy foods. If nausea and/or vomiting occur, drink only clear liquids until the nausea and/or vomiting subsides. Call your physician if vomiting continues. ? ?Special Instructions/Symptoms: ?Your throat may feel dry or sore from the anesthesia or the breathing tube placed in your throat during surgery. If this causes discomfort, gargle with warm salt water. The discomfort should disappear within 24 hours. ? ?If you had a scopolamine patch placed behind your ear for the management of post- operative nausea and/or vomiting: ? ?1. The  medication in the patch is effective for 72 hours, after which it should be removed.  Wrap patch in a tissue and discard in the trash. Wash hands thoroughly with soap and water. ?2. You may remove the patch earlier than 72 hours if you experience unpleasant side effects which may include dry mouth, dizziness or visual disturbances. ?3. Avoid touching the patch. Wash your hands with soap and water after contact with the patch. ?    ?May have Tylenol at 2pm today ?May have Ibuprofen at 4:45pm today  ?

## 2021-07-29 NOTE — Anesthesia Procedure Notes (Signed)
Procedure Name: LMA Insertion ?Date/Time: 07/29/2021 9:43 AM ?Performed by: Lavonia Dana, CRNA ?Pre-anesthesia Checklist: Patient identified, Emergency Drugs available, Suction available and Patient being monitored ?Patient Re-evaluated:Patient Re-evaluated prior to induction ?Oxygen Delivery Method: Circle system utilized ?Preoxygenation: Pre-oxygenation with 100% oxygen ?Induction Type: IV induction ?Ventilation: Mask ventilation without difficulty ?LMA: LMA inserted ?LMA Size: 4.0 ?Number of attempts: 1 ?Airway Equipment and Method: Bite block ?Placement Confirmation: positive ETCO2 ?Tube secured with: Tape ?Dental Injury: Teeth and Oropharynx as per pre-operative assessment  ? ? ? ? ?

## 2021-07-29 NOTE — Transfer of Care (Signed)
Immediate Anesthesia Transfer of Care Note ? ?Patient: Amy Madden ? ?Procedure(s) Performed: IRRIGATION AND DEBRIDEMENT OF WOUND WITH SPLIT THICKNESS SKIN GRAFT (Right: Leg Lower) ? ?Patient Location: PACU ? ?Anesthesia Type:General ? ?Level of Consciousness: awake, alert  and oriented ? ?Airway & Oxygen Therapy: Patient Spontanous Breathing and Patient connected to face mask oxygen ? ?Post-op Assessment: Report given to RN and Post -op Vital signs reviewed and stable ? ?Post vital signs: Reviewed and stable ? ?Last Vitals:  ?Vitals Value Taken Time  ?BP 156/96 07/29/21 1038  ?Temp 36.8 ?C 07/29/21 1038  ?Pulse 88 07/29/21 1043  ?Resp 22 07/29/21 1043  ?SpO2 100 % 07/29/21 1043  ?Vitals shown include unvalidated device data. ? ?Last Pain:  ?Vitals:  ? 07/29/21 0747  ?TempSrc: Oral  ?PainSc: 5   ?   ? ?  ? ?Complications: No notable events documented. ?

## 2021-07-29 NOTE — Interval H&P Note (Signed)
History and Physical Interval Note: ? ?07/29/2021 ?9:14 AM ? ?Amy Madden  has presented today for surgery, with the diagnosis of Leg wound.  The various methods of treatment have been discussed with the patient and family. After consideration of risks, benefits and other options for treatment, the patient has consented to  Procedure(s): ?IRRIGATION AND DEBRIDEMENT OF WOUND WITH SPLIT THICKNESS SKIN GRAFT (Right) as a surgical intervention.  The patient's history has been reviewed, patient examined, no change in status, stable for surgery.  I have reviewed the patient's chart and labs.  Questions were answered to the patient's satisfaction.   ? ? ?[name] ? ? ?

## 2021-07-29 NOTE — Anesthesia Preprocedure Evaluation (Addendum)
Anesthesia Evaluation  ?Patient identified by MRN, date of birth, ID band ?Patient awake ? ? ? ?Reviewed: ?Allergy & Precautions, NPO status , Patient's Chart, lab work & pertinent test results ? ?Airway ?Mallampati: II ? ?TM Distance: >3 FB ?Neck ROM: Full ? ? ? Dental ? ?(+) Caps ?  ?Pulmonary ?Current SmokerPatient did not abstain from smoking.,  ?  ?Pulmonary exam normal ? ? ? ? ? ? ? Cardiovascular ?negative cardio ROS ?Normal cardiovascular exam ? ? ?  ?Neuro/Psych ?negative neurological ROS ? negative psych ROS  ? GI/Hepatic ?negative GI ROS, (+)  ?  ? substance abuse ? ,   ?Endo/Other  ?negative endocrine ROS ? Renal/GU ?negative Renal ROS  ? ?  ?Musculoskeletal ?negative musculoskeletal ROS ?(+)  ? Abdominal ?(+) + obese,   ?Peds ? Hematology ?negative hematology ROS ?(+)   ?Anesthesia Other Findings ?Leg wound ? Reproductive/Obstetrics ? ?  ? ? ? ? ? ? ? ? ? ? ? ? ? ?  ?  ? ? ? ? ? ? ? ?Anesthesia Physical ?Anesthesia Plan ? ?ASA: 2 ? ?Anesthesia Plan: General  ? ?Post-op Pain Management:   ? ?Induction: Intravenous ? ?PONV Risk Score and Plan: 2 and Ondansetron, Dexamethasone, Midazolam and Treatment may vary due to age or medical condition ? ?Airway Management Planned: LMA ? ?Additional Equipment:  ? ?Intra-op Plan:  ? ?Post-operative Plan: Extubation in OR ? ?Informed Consent: I have reviewed the patients History and Physical, chart, labs and discussed the procedure including the risks, benefits and alternatives for the proposed anesthesia with the patient or authorized representative who has indicated his/her understanding and acceptance.  ? ? ? ?Dental advisory given ? ?Plan Discussed with: CRNA ? ?Anesthesia Plan Comments:   ? ? ? ? ? ?Anesthesia Quick Evaluation ? ?

## 2021-07-30 ENCOUNTER — Encounter (HOSPITAL_BASED_OUTPATIENT_CLINIC_OR_DEPARTMENT_OTHER): Payer: Self-pay | Admitting: Plastic Surgery
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
## 2021-08-04 ENCOUNTER — Encounter: Payer: Self-pay | Admitting: Physician Assistant

## 2021-08-04 ENCOUNTER — Telehealth: Payer: Self-pay | Admitting: *Deleted

## 2021-08-04 ENCOUNTER — Ambulatory Visit (INDEPENDENT_AMBULATORY_CARE_PROVIDER_SITE_OTHER): Payer: Self-pay | Admitting: Physician Assistant

## 2021-08-04 DIAGNOSIS — S81801A Unspecified open wound, right lower leg, initial encounter: Secondary | ICD-10-CM

## 2021-08-04 NOTE — Telephone Encounter (Signed)
Faxed order to [employer] for wound supplies for the patient.  Confirmation received and copy scanned into the chart.//AB/CMA ?
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
## 2021-08-21 ENCOUNTER — Encounter: Payer: Self-pay | Admitting: Physician Assistant

## 2021-08-22 ENCOUNTER — Ambulatory Visit: Payer: Self-pay | Admitting: Physician Assistant

## 2021-08-28 NOTE — Progress Notes (Deleted)
No show

## 2021-09-01 ENCOUNTER — Ambulatory Visit: Payer: Self-pay | Admitting: Physician Assistant

## 2021-09-01 DIAGNOSIS — S81801D Unspecified open wound, right lower leg, subsequent encounter: Secondary | ICD-10-CM

## 2023-12-10 ENCOUNTER — Encounter (HOSPITAL_COMMUNITY): Payer: Self-pay

## 2023-12-10 ENCOUNTER — Emergency Department (HOSPITAL_COMMUNITY): Payer: Self-pay

## 2023-12-10 ENCOUNTER — Other Ambulatory Visit: Payer: Self-pay

## 2023-12-10 ENCOUNTER — Inpatient Hospital Stay (HOSPITAL_COMMUNITY)
Admission: EM | Admit: 2023-12-10 | Discharge: 2023-12-12 | DRG: 053 | Disposition: A | Discharge status: Home / Self Care | Payer: Self-pay | Attending: Neurosurgery | Admitting: Neurosurgery

## 2023-12-10 ENCOUNTER — Emergency Department (HOSPITAL_COMMUNITY): Payer: MEDICAID

## 2023-12-10 DIAGNOSIS — Z91012 Allergy to eggs: Secondary | ICD-10-CM

## 2023-12-10 DIAGNOSIS — Y93E1 Activity, personal bathing and showering: Secondary | ICD-10-CM

## 2023-12-10 DIAGNOSIS — W182XXA Fall in (into) shower or empty bathtub, initial encounter: Secondary | ICD-10-CM | POA: Diagnosis present

## 2023-12-10 DIAGNOSIS — Y92009 Unspecified place in unspecified non-institutional (private) residence as the place of occurrence of the external cause: Secondary | ICD-10-CM

## 2023-12-10 DIAGNOSIS — S14109A Unspecified injury at unspecified level of cervical spinal cord, initial encounter: Principal | ICD-10-CM | POA: Diagnosis present

## 2023-12-10 DIAGNOSIS — M47812 Spondylosis without myelopathy or radiculopathy, cervical region: Secondary | ICD-10-CM | POA: Diagnosis present

## 2023-12-10 DIAGNOSIS — F17210 Nicotine dependence, cigarettes, uncomplicated: Secondary | ICD-10-CM | POA: Diagnosis present

## 2023-12-10 DIAGNOSIS — Z91018 Allergy to other foods: Secondary | ICD-10-CM

## 2023-12-10 DIAGNOSIS — M4802 Spinal stenosis, cervical region: Secondary | ICD-10-CM | POA: Diagnosis present

## 2023-12-10 LAB — CBC
HCT: 35.9 % — ABNORMAL LOW (ref 36.0–46.0)
Hemoglobin: 12.1 g/dL (ref 12.0–15.0)
MCH: 29.7 pg (ref 26.0–34.0)
MCHC: 33.7 g/dL (ref 30.0–36.0)
MCV: 88 fL (ref 80.0–100.0)
Platelets: 134 K/uL — ABNORMAL LOW (ref 150–400)
RBC: 4.08 MIL/uL (ref 3.87–5.11)
RDW: 12.6 % (ref 11.5–15.5)
WBC: 3.7 K/uL — ABNORMAL LOW (ref 4.0–10.5)
nRBC: 0 % (ref 0.0–0.2)

## 2023-12-10 LAB — CBC WITH DIFFERENTIAL/PLATELET
Abs Immature Granulocytes: 0.02 K/uL (ref 0.00–0.07)
Basophils Absolute: 0 K/uL (ref 0.0–0.1)
Basophils Relative: 1 %
Eosinophils Absolute: 0 K/uL (ref 0.0–0.5)
Eosinophils Relative: 0 %
HCT: 39 % (ref 36.0–46.0)
Hemoglobin: 12.2 g/dL (ref 12.0–15.0)
Immature Granulocytes: 1 %
Lymphocytes Relative: 21 %
Lymphs Abs: 0.9 K/uL (ref 0.7–4.0)
MCH: 29.3 pg (ref 26.0–34.0)
MCHC: 31.3 g/dL (ref 30.0–36.0)
MCV: 93.8 fL (ref 80.0–100.0)
Monocytes Absolute: 0.4 K/uL (ref 0.1–1.0)
Monocytes Relative: 9 %
Neutro Abs: 2.9 K/uL (ref 1.7–7.7)
Neutrophils Relative %: 68 %
Platelets: 154 K/uL (ref 150–400)
RBC: 4.16 MIL/uL (ref 3.87–5.11)
RDW: 12.6 % (ref 11.5–15.5)
WBC: 4.3 K/uL (ref 4.0–10.5)
nRBC: 0 % (ref 0.0–0.2)

## 2023-12-10 LAB — I-STAT CHEM 8, ED
BUN: 7 mg/dL (ref 6–20)
Calcium, Ion: 0.9 mmol/L — ABNORMAL LOW (ref 1.15–1.40)
Chloride: 113 mmol/L — ABNORMAL HIGH (ref 98–111)
Creatinine, Ser: 0.5 mg/dL (ref 0.44–1.00)
Glucose, Bld: 100 mg/dL — ABNORMAL HIGH (ref 70–99)
HCT: 36 % (ref 36.0–46.0)
Hemoglobin: 12.2 g/dL (ref 12.0–15.0)
Potassium: 3.8 mmol/L (ref 3.5–5.1)
Sodium: 141 mmol/L (ref 135–145)
TCO2: 17 mmol/L — ABNORMAL LOW (ref 22–32)

## 2023-12-10 LAB — BASIC METABOLIC PANEL WITH GFR
Anion gap: 10 (ref 5–15)
BUN: 7 mg/dL (ref 6–20)
CO2: 15 mmol/L — ABNORMAL LOW (ref 22–32)
Calcium: 7.5 mg/dL — ABNORMAL LOW (ref 8.9–10.3)
Chloride: 113 mmol/L — ABNORMAL HIGH (ref 98–111)
Creatinine, Ser: 0.65 mg/dL (ref 0.44–1.00)
GFR, Estimated: >60 mL/min (ref >60)
Glucose, Bld: 101 mg/dL — ABNORMAL HIGH (ref 70–99)
Potassium: 3.8 mmol/L (ref 3.5–5.1)
Sodium: 138 mmol/L (ref 135–145)

## 2023-12-10 LAB — CREATININE, SERUM
Creatinine, Ser: 0.66 mg/dL (ref 0.44–1.00)
GFR, Estimated: >60 mL/min (ref >60)

## 2023-12-10 LAB — HCG, SERUM, QUALITATIVE: Preg, Serum: NEGATIVE

## 2023-12-10 MED ORDER — HYDROMORPHONE HCL 1 MG/ML IJ SOLN
0.5000 mg | INTRAMUSCULAR | Status: DC | PRN
  Administered 2023-12-10 – 2023-12-11 (×5): 1 mg via INTRAVENOUS
  Administered 2023-12-11: 0.5 mg via INTRAVENOUS
  Administered 2023-12-11 (×3): 1 mg via INTRAVENOUS
  Filled 2023-12-10 (×9): qty 1

## 2023-12-10 MED ORDER — ACETAMINOPHEN 650 MG RE SUPP: 650.0000 mg | Freq: Four times a day (QID) | RECTAL | Status: DC | PRN

## 2023-12-10 MED ORDER — SODIUM CHLORIDE 0.9% FLUSH
3.0000 mL | Freq: Two times a day (BID) | INTRAVENOUS | Status: DC
Start: 2023-12-10 — End: 2023-12-12
  Administered 2023-12-10 – 2023-12-12 (×4): 3 mL via INTRAVENOUS

## 2023-12-10 MED ORDER — HYDROMORPHONE HCL 1 MG/ML IJ SOLN
1.0000 mg | Freq: Once | INTRAMUSCULAR | Status: AC
  Administered 2023-12-10: 1 mg via INTRAVENOUS
  Filled 2023-12-10: qty 1

## 2023-12-10 MED ORDER — ENOXAPARIN SODIUM 40 MG/0.4ML IJ SOSY
40.0000 mg | PREFILLED_SYRINGE | INTRAMUSCULAR | Status: DC
  Administered 2023-12-10: 40 mg via SUBCUTANEOUS
  Filled 2023-12-10: qty 0.4

## 2023-12-10 MED ORDER — HYDROMORPHONE HCL 1 MG/ML IJ SOLN
0.5000 mg | Freq: Once | INTRAMUSCULAR | Status: AC
  Administered 2023-12-10: 0.5 mg via INTRAVENOUS
  Filled 2023-12-10: qty 1

## 2023-12-10 MED ORDER — FENTANYL CITRATE PF 50 MCG/ML IJ SOSY
50.0000 ug | PREFILLED_SYRINGE | Freq: Once | INTRAMUSCULAR | Status: AC
  Administered 2023-12-10: 50 ug via INTRAVENOUS
  Filled 2023-12-10: qty 1

## 2023-12-10 MED ORDER — ONDANSETRON HCL 4 MG PO TABS: 4.0000 mg | ORAL_TABLET | Freq: Four times a day (QID) | ORAL | Status: DC | PRN

## 2023-12-10 MED ORDER — SODIUM CHLORIDE 0.9 % IV SOLN: 250.0000 mL | INTRAVENOUS | Status: AC | PRN

## 2023-12-10 MED ORDER — KETOROLAC TROMETHAMINE 15 MG/ML IJ SOLN
15.0000 mg | Freq: Once | INTRAMUSCULAR | Status: AC
  Administered 2023-12-10: 15 mg via INTRAVENOUS
  Filled 2023-12-10: qty 1

## 2023-12-10 MED ORDER — ACETAMINOPHEN 500 MG PO TABS
1000.0000 mg | ORAL_TABLET | Freq: Once | ORAL | Status: AC
  Administered 2023-12-10: 1000 mg via ORAL
  Filled 2023-12-10: qty 2

## 2023-12-10 MED ORDER — SODIUM CHLORIDE 0.9% FLUSH: 3.0000 mL | INTRAVENOUS | Status: DC | PRN

## 2023-12-10 MED ORDER — LACTATED RINGERS IV BOLUS
1000.0000 mL | Freq: Once | INTRAVENOUS | Status: AC
  Administered 2023-12-10: 1000 mL via INTRAVENOUS

## 2023-12-10 MED ORDER — ONDANSETRON HCL 4 MG/2ML IJ SOLN: 4.0000 mg | Freq: Four times a day (QID) | INTRAMUSCULAR | Status: DC | PRN

## 2023-12-10 MED ORDER — GABAPENTIN 300 MG PO CAPS
300.0000 mg | ORAL_CAPSULE | Freq: Once | ORAL | Status: AC
  Administered 2023-12-10: 300 mg via ORAL
  Filled 2023-12-10: qty 1

## 2023-12-10 MED ORDER — DEXAMETHASONE SODIUM PHOSPHATE 4 MG/ML IJ SOLN
4.0000 mg | Freq: Four times a day (QID) | INTRAMUSCULAR | Status: DC
Start: 2023-12-10 — End: 2023-12-11
  Administered 2023-12-10 – 2023-12-11 (×6): 4 mg via INTRAVENOUS
  Filled 2023-12-10 (×6): qty 1

## 2023-12-10 MED ORDER — METHOCARBAMOL 1000 MG/10ML IJ SOLN
500.0000 mg | Freq: Four times a day (QID) | INTRAMUSCULAR | Status: DC | PRN
  Administered 2023-12-11 (×2): 500 mg via INTRAVENOUS
  Filled 2023-12-10 (×3): qty 10

## 2023-12-10 MED ORDER — ACETAMINOPHEN 325 MG PO TABS: 650.0000 mg | ORAL_TABLET | Freq: Four times a day (QID) | ORAL | Status: DC | PRN

## 2023-12-10 MED ORDER — GABAPENTIN 300 MG PO CAPS
600.0000 mg | ORAL_CAPSULE | Freq: Three times a day (TID) | ORAL | Status: DC
  Administered 2023-12-10 – 2023-12-12 (×6): 600 mg via ORAL
  Filled 2023-12-10 (×6): qty 2

## 2023-12-10 MED ORDER — DOCUSATE SODIUM 100 MG PO CAPS
100.0000 mg | ORAL_CAPSULE | Freq: Two times a day (BID) | ORAL | Status: DC
  Administered 2023-12-10 – 2023-12-12 (×5): 100 mg via ORAL
  Filled 2023-12-10 (×5): qty 1

## 2023-12-10 MED ORDER — OXYCODONE HCL 5 MG PO TABS
5.0000 mg | ORAL_TABLET | ORAL | Status: DC | PRN
  Administered 2023-12-11 (×2): 5 mg via ORAL
  Filled 2023-12-10 (×2): qty 1

## 2023-12-10 NOTE — H&P (Signed)
 Amy Madden is an 43 y.o. female.   Chief Complaint: Pain and weakness HPI: 43 year old female without significant major medical history presents to the emergency department after a fall in the shower.  Patient struck the back of her head and had immediate onset of radiating pain numbness and dysesthesias into both upper extremities with some weakness of her arms and hands.  Workup demonstrates evidence of marked multilevel cervical spondylosis with broad-based disc bulging and severe spinal stenosis with ongoing cord compression at C3-4, C4-5, C5-6 and C6-7.  There is accompanying signal abnormality.  There is no evidence of acute severe ligamentous injury.  Past Medical History:  Diagnosis Date   Avulsion fracture of ankle 07/03/2021    Past Surgical History:  Procedure Laterality Date   CESAREAN SECTION     x2   INCISION AND DRAINAGE OF WOUND Right 07/09/2021   Procedure: RIGHT LEG DEBRIDEMENT WITH MYRIAD PLACEMNT;  Surgeon: Lowery Estefana RAMAN, DO;  Location: [facility];  Service: Plastics;  Laterality: Right;   IRRIGATION AND DEBRIDEMENT OF WOUND WITH SPLIT THICKNESS SKIN GRAFT Right 07/29/2021   Procedure: IRRIGATION AND DEBRIDEMENT OF WOUND WITH SPLIT THICKNESS SKIN GRAFT;  Surgeon: Marene Sieving, MD;  Location: [facility];  Service: Plastics;  Laterality: Right;   KNEE ARTHROSCOPY Right     History reviewed. No pertinent family history. Social History:  reports that she has been smoking cigarettes. She does not have any smokeless tobacco history on file. She reports current drug use. Drug: Marijuana. She reports that she does not drink alcohol.  Allergies:  Allergies  Allergen Reactions   No [facility] Food Allergies Hives    Tomatos, Eggs, and Chocolate    (Not in a hospital admission)   Results for orders placed or performed during the hospital encounter of 12/10/23 (from the past 48 hours)  Basic metabolic panel     Status: Abnormal    Collection Time: 12/10/23  8:10 AM  Result Value Ref Range   Sodium 138 135 - 145 mmol/L   Potassium 3.8 3.5 - 5.1 mmol/L    Comment: HEMOLYSIS AT THIS LEVEL MAY AFFECT RESULT   Chloride 113 (H) 98 - 111 mmol/L   CO2 15 (L) 22 - 32 mmol/L   Glucose, Bld 101 (H) 70 - 99 mg/dL    Comment: Glucose reference range applies only to samples taken after fasting for at least 8 hours.   BUN 7 6 - 20 mg/dL   Creatinine, Ser 9.34 0.44 - 1.00 mg/dL   Calcium 7.5 (L) 8.9 - 10.3 mg/dL   GFR, Estimated >39 >39 mL/min    Comment: (NOTE) Calculated using the CKD-EPI Creatinine Equation (2021)    Anion gap 10 5 - 15    Comment: Performed at [facility], [address] [place], [place] [address]  CBC with Differential     Status: None   Collection Time: 12/10/23  8:10 AM  Result Value Ref Range   WBC 4.3 4.0 - 10.5 K/uL   RBC 4.16 3.87 - 5.11 MIL/uL   Hemoglobin 12.2 12.0 - 15.0 g/dL   HCT 60.9 63.9 - 53.9 %   MCV 93.8 80.0 - 100.0 fL   MCH 29.3 26.0 - 34.0 pg   MCHC 31.3 30.0 - 36.0 g/dL   RDW 87.3 88.4 - 84.4 %   Platelets 154 150 - 400 K/uL   nRBC 0.0 0.0 - 0.2 %   Neutrophils Relative % 68 %   Neutro Abs  2.9 1.7 - 7.7 K/uL   Lymphocytes Relative 21 %   Lymphs Abs 0.9 0.7 - 4.0 K/uL   Monocytes Relative 9 %   Monocytes Absolute 0.4 0.1 - 1.0 K/uL   Eosinophils Relative 0 %   Eosinophils Absolute 0.0 0.0 - 0.5 K/uL   Basophils Relative 1 %   Basophils Absolute 0.0 0.0 - 0.1 K/uL   Immature Granulocytes 1 %   Abs Immature Granulocytes 0.02 0.00 - 0.07 K/uL    Comment: Performed at [facility], [address], [place], [place] [address]  hCG, serum, qualitative     Status: None   Collection Time: 12/10/23  8:10 AM  Result Value Ref Range   Preg, Serum NEGATIVE NEGATIVE    Comment:        THE SENSITIVITY OF THIS METHODOLOGY IS >10 mIU/mL. Performed at [facility], [address] [place], [place] [address]   I-stat chem 8, ED     Status: Abnormal   Collection  Time: 12/10/23  8:33 AM  Result Value Ref Range   Sodium 141 135 - 145 mmol/L   Potassium 3.8 3.5 - 5.1 mmol/L   Chloride 113 (H) 98 - 111 mmol/L   BUN 7 6 - 20 mg/dL   Creatinine, Ser 9.49 0.44 - 1.00 mg/dL   Glucose, Bld 899 (H) 70 - 99 mg/dL    Comment: Glucose reference range applies only to samples taken after fasting for at least 8 hours.   Calcium, Ion 0.90 (L) 1.15 - 1.40 mmol/L   TCO2 17 (L) 22 - 32 mmol/L   Hemoglobin 12.2 12.0 - 15.0 g/dL   HCT 63.9 63.9 - 53.9 %   MR Cervical Spine Wo Contrast Addendum Date: 12/10/2023 ADDENDUM REPORT: 12/10/2023 12:06 ADDENDUM: Study discussed by telephone with Dr. ELSIE BODY on 12/10/2023 at 1156 hours. Electronically Signed   By: VEAR Hurst M.D.   On: 12/10/2023 12:06   Result Date: 12/10/2023 CLINICAL DATA:  43 year old female status post fall in shower. Neurologic deficit, paresthesia, myelopathy. EXAM: MRI CERVICAL SPINE WITHOUT CONTRAST TECHNIQUE: Multiplanar, multisequence MR imaging of the cervical spine was performed. No intravenous contrast was administered. COMPARISON:  Cervical spine CT 0844 hours today. FINDINGS: Alignment: Mild additional straightening of lordosis when compared to the CT earlier today. Vertebrae: Bulky degenerative endplate spurring as noted on CT, maximal at C5-C6. See additional details below. Associated chronic degenerative marrow signal changes. Normal background bone marrow signal. No marrow edema or evidence of acute osseous abnormality. Cord: Abnormal. Multilevel degenerative spinal cord mass effect, compression. Patchy abnormal T2 and STIR hyperintense signal within the left hemi cord at C3-C4 (series 6, image 19). Subtle more bilateral heterogeneous signal abnormality within the cord at the C5 vertebral level (series 6, image 25) which is situated between levels of cord mass effect, which is maximal at C5-C6. Axial and sagittal T2 * imaging reveals no convincing spinal cord blood products. No definite cord  expansion. Posterior Fossa, vertebral arteries, paraspinal tissues: Cervicomedullary junction is within normal limits. Negative visible posterior fossa. Grossly negative visible brain parenchyma; 4 mm T2 hyperintense area in the posterior pituitary with no regional mass effect or invasion. No further imaging evaluation is necessary. This follows ACR consensus guidelines: Management of Incidental Pituitary Findings on CT, MRI and F18-FDG PET: A White Paper of the ACR Incidental Findings Committee. J Am Coll Radiol 2018; 15: 033-27. Difficult to exclude mild signal abnormality in the right C2-C3 interspinous ligament series 4, image 5.  But no other convincing signal abnormality in the cervical spine ligamentous complex. No paraspinal muscle signal abnormality. No prevertebral fluid. Disc levels: C2-C3:  Negative. C3-C4: Moderate sized broad-based and lobulated posterior disc protrusion or extrusion (series 2, image 8). Moderate spinal stenosis with mild spinal cord mass effect. Abnormal left hemi cord signal here. Disc osteophyte complex at the foramina, moderate bilateral C4 foraminal stenosis. C4-C5: Smaller broad-based posterior and lobulated disc protrusion or small extrusion (series 6, image 23). Mild spinal stenosis and spinal cord mass effect. Foraminal disc osteophyte complex mostly more so on the right. Moderate right C5 foraminal stenosis. C5-C6: Bulky circumferential disc or disc osteophyte complex, seem to be partially calcified posteriorly by CT. Superimposed mild facet and ligament flavum hypertrophy. Moderate spinal stenosis and spinal cord mass effect slightly eccentric to the right (series 6, image 28). Severe bilateral C6 foraminal stenosis greater on the left. C6-C7: Some midline posterior longitudinal ligament thickening or small volume disc migration from the level above (series 2, image 9). Circumferential disc bulging with left paracentral small disc protrusion or extrusion best seen on series  6, image 32 and series 2, image 10. Moderate spinal stenosis and left hemi cord mass effect. Foraminal disc osteophyte complex with mild to moderate C7 foraminal stenosis. C7-T1:  Mild facet hypertrophy.  Otherwise negative. No visible upper thoracic stenosis. IMPRESSION: 1. Age advanced and bulky Cervical Spine degeneration with disc herniations resulting in multilevel spinal stenosis AND cord mass effect C3-C4 through C6-C7 WITH associated spinal cord signal abnormality. The latter is indeterminate for posttraumatic cord contusion versus myelomalacia at C3-C4 (on the left) and at C5. No spinal cord blood products identified. 2. Mild interspinous ligament injury difficult to exclude. No other ligamentous injury identified. 3. Associated multilevel degenerative neural foraminal stenosis, severe at the bilateral C6 nerve levels. Electronically Signed: By: VEAR Hurst M.D. On: 12/10/2023 11:51   DG Shoulder Left Result Date: 12/10/2023 CLINICAL DATA:  Left shoulder pain after fall today. EXAM: LEFT SHOULDER - 2+ VIEW COMPARISON:  None Available. FINDINGS: There is no evidence of fracture or dislocation. There is no evidence of arthropathy or other focal bone abnormality. Soft tissues are unremarkable. IMPRESSION: Negative. Electronically Signed   By: Lynwood Landy Raddle M.D.   On: 12/10/2023 10:03   DG Shoulder Right Result Date: 12/10/2023 CLINICAL DATA:  Right shoulder pain after fall today. EXAM: RIGHT SHOULDER - 2+ VIEW COMPARISON:  None Available. FINDINGS: There is no evidence of fracture or dislocation. There is no evidence of arthropathy or other focal bone abnormality. Soft tissues are unremarkable. IMPRESSION: Negative. Electronically Signed   By: Lynwood Landy Raddle M.D.   On: 12/10/2023 10:03   CT Cervical Spine Wo Contrast Result Date: 12/10/2023 EXAM: CT CERVICAL SPINE WITHOUT CONTRAST 12/10/2023 08:53:47 AM TECHNIQUE: CT of the cervical spine was performed without the administration of intravenous  contrast. Multiplanar reformatted images are provided for review. Automated exposure control, iterative reconstruction, and/or weight based adjustment of the mA/kV was utilized to reduce the radiation dose to as low as reasonably achievable. COMPARISON: None available. CLINICAL HISTORY: Neck trauma, focal neuro deficit or paresthesia (Age 47-64y). Pt BIB [facility] from home for a mechanical fall in shower. Pt caught self with arms and had to be pulled from shower. Pt is able to move arms but has extremely high sensitivity to touch on arms. Pt hit forehead when she fell as well. Pt is A\T\O. No LOC, no thinners. FINDINGS: CERVICAL SPINE: BONES AND ALIGNMENT: Chronic appearing deformity of  the anterior right first rib. 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 DEGENERATIVE CHANGES: Degenerative endplate osteophytes most pronounced at C5-6. Mild disc space narrowing at C5-6. There are disc bulges at multiple levels. Partially calcified disc bulge at C5-6 resulting in at least mild-to-moderate spinal canal stenosis. Facet arthrosis and uncovertebral hypertrophy at multiple levels. SOFT TISSUES: No prevertebral soft tissue swelling. IMPRESSION: 1. No acute abnormality of the cervical spine related to the reported neck trauma. 2. Degenerative changes greatest at C5-6 where there is at least mild-to-moderate spinal canal stenosis. Electronically signed by: Donnice Mania MD 12/10/2023 09:11 AM EDT RP Workstation: [id number]   CT Head Wo Contrast Result Date: 12/10/2023 EXAM: CT HEAD WITHOUT CONTRAST 12/10/2023 08:53:47 AM TECHNIQUE: CT of the head was performed without the administration of intravenous contrast. Automated exposure control, iterative reconstruction, and/or weight based adjustment of the mA/kV was utilized to reduce the radiation dose to as low as reasonably achievable. COMPARISON: None available. CLINICAL HISTORY: Head trauma, moderate-severe. Pt BIB [facility] from home for a mechanical fall in shower. Pt  caught self with arms and had to be pulled from shower. Pt is able to move arms but has extremely high sensitivity to touch on arms. Pt hit forehead when she fell as well. Pt is A\T\O. No LOC, no thinners. FINDINGS: BRAIN AND VENTRICLES: No acute hemorrhage. No evidence of acute infarct. No hydrocephalus. No extra-axial collection. No mass effect or midline shift. ORBITS: No acute abnormality. SINUSES: Mild mucosal thickening in the paranasal sinuses. SOFT TISSUES AND SKULL: No acute soft tissue abnormality. No skull fracture. IMPRESSION: 1. No acute intracranial abnormality related to the reported head trauma. Electronically signed by: Donnice Mania MD 12/10/2023 09:05 AM EDT RP Workstation: [id number]    Pertinent items noted in HPI and remainder of comprehensive ROS otherwise negative.  Blood pressure (!) 148/67, pulse 82, temperature 98.9 F (37.2 C), temperature source Oral, resp. rate 17, height 5' 7 (1.702 m), weight 81.6 kg, last menstrual period 11/05/2023, SpO2 100%.  Patient is awake and alert.  She is oriented and appropriate.  She is obviously in a great deal of pain.  Speech is fluent.  Judgment insight are intact.  Cranial nerve function normal bilateral.  Motor examination is limited secondary to her dysesthesias in both upper extremities.  The patient has good deltoid and biceps muscle strength bilaterally.  She has reasonably good tricep strength bilaterally.  She has minimal grip strength and 2/5 intrinsic function in both hands.  She has near normal strength in both lower extremities.  She has mildly increased tone in both upper and lower extremities.  Reflexes in her lower extremities are increased.  Sensory examination reveals severe dysesthesia in both upper extremities from at least the C5 level distally.  Her sensory function in her lower extremities is reasonably normal without major dysesthesias.  Examination head ears touching her throat is red.  Chest and abdomen are benign.   Extremities are free from injury or deformity.  Cervical spine without gross bony abnormality.  Airway midline.  Pulses normal. Assessment/Plan Patient with significant cervical stenosis status post fall with incomplete cervical central cord injury.  Plan to admit.  Work on pain control.  Begin steroids.  Continue collar.  Mobilize gently.  Wait for swelling to reseed and possibly offer multilevel anterior cervical decompression and fusion later next week.  Victory A Keary Waterson 12/10/2023, 1:55 PM

## 2023-12-10 NOTE — ED Triage Notes (Addendum)
 Pt BIB [facility] from home for a mechanical fall in shower.  Pt caught self with arms and had to be pulled from shower.  Pt is able to move arms but has extremely high sensitivity to touch on arms. Pt hit forehead when she fell as well.  Pt is A&O. No LOC, no thinners.   Medic endorses having a pt iwith same symptoms in past that had a C-spine inury.  Pt is in a collar.  PT received 2 50 mcg doses of Fentanyl  and 2 16.5 mg doses of Ketamine.  EMS VS: 164/102 100% RA, ETC02 33, RR 14

## 2023-12-10 NOTE — ED Notes (Signed)
 Pt assisted with bedpan

## 2023-12-10 NOTE — ED Notes (Signed)
 Neuro surgery at bedside.

## 2023-12-10 NOTE — ED Notes (Signed)
 Pt changed into [place] [facility]

## 2023-12-10 NOTE — ED Notes (Signed)
 Patient transported to MRI

## 2023-12-10 NOTE — ED Notes (Signed)
 Patient transported to CT

## 2023-12-10 NOTE — ED Provider Notes (Signed)
 [place] EMERGENCY DEPARTMENT AT [facility] Provider Note  CSN: [id number] Arrival date & time: 12/10/23 0730  Chief Complaint(s) Arm Pain  HPI Amy Madden is a 43 y.o. female without significant past medical history presenting to the emergency department with neck pain.  Patient reports that she was in her shower, slipped on the water, fell headfirst into the wall, head snapped back and then she fell onto the tub.  Was unable to get up on her own.  Daughter called paramedics.  Placed on cervical collar and brought to the ER.  Reports that she has burning pain, tingling going down both arms.  Able to move her arms.  No nausea or vomiting.  Reports some mild headache also.  Reports neck pain.  No pain in the low back or legs.  No numbness or tingling in the legs.  Denies any similar episode  Past Medical History Past Medical History:  Diagnosis Date   Avulsion fracture of ankle 07/03/2021   Patient Active Problem List   Diagnosis Date Noted   Cervical spinal cord injury (HCC) 12/10/2023   Wound of right leg 07/07/2021   Tobacco abuse 07/04/2021   Avulsion fracture of right ankle 07/03/2021   Home Medication(s) Prior to Admission medications   Medication Sig Start Date End Date Taking? Authorizing Provider  ibuprofen (ADVIL) 200 MG tablet Take 200-800 mg by mouth every 6 (six) hours as needed for moderate pain (pain score 4-6).   Yes [provider]                                                                                                                                    Past Surgical History Past Surgical History:  Procedure Laterality Date   CESAREAN SECTION     x2   INCISION AND DRAINAGE OF WOUND Right 07/09/2021   Procedure: RIGHT LEG DEBRIDEMENT WITH MYRIAD PLACEMNT;  Surgeon: Lowery Estefana RAMAN, DO;  Location: [facility];  Service: Plastics;  Laterality: Right;   IRRIGATION AND DEBRIDEMENT OF WOUND WITH SPLIT THICKNESS SKIN GRAFT  Right 07/29/2021   Procedure: IRRIGATION AND DEBRIDEMENT OF WOUND WITH SPLIT THICKNESS SKIN GRAFT;  Surgeon: Marene Sieving, MD;  Location: [facility];  Service: Plastics;  Laterality: Right;   KNEE ARTHROSCOPY Right    Family History History reviewed. No pertinent family history.  Social History Social History   Tobacco Use   Smoking status: Every Day    Current packs/day: 0.50    Types: Cigarettes  Vaping Use   Vaping status: Never Used  Substance Use Topics   Alcohol use: Never   Drug use: Yes    Types: Marijuana    Comment: last time: 1 week ago   Allergies No healthtouch food allergies  Review of Systems Review of Systems  All other systems reviewed and are negative.   Physical Exam Vital Signs  I have reviewed the triage  vital signs BP (!) 148/67 (BP Location: Right Leg)   Pulse 82   Temp 98.9 F (37.2 C) (Oral)   Resp 17   Ht 5' 7 (1.702 m)   Wt 81.6 kg   LMP 11/05/2023 (Approximate)   SpO2 100%   BMI 28.19 kg/m  Physical Exam Vitals and nursing note reviewed.  Constitutional:      General: She is in acute distress.     Appearance: She is well-developed.  HENT:     Head: Normocephalic and atraumatic.     Mouth/Throat:     Mouth: Mucous membranes are moist.  Eyes:     Pupils: Pupils are equal, round, and reactive to light.  Neck:     Comments: C-collar in place Cardiovascular:     Rate and Rhythm: Normal rate and regular rhythm.     Heart sounds: No murmur heard. Pulmonary:     Effort: Pulmonary effort is normal. No respiratory distress.     Breath sounds: Normal breath sounds.  Abdominal:     General: Abdomen is flat.     Palpations: Abdomen is soft.     Tenderness: There is no abdominal tenderness.  Musculoskeletal:        General: No tenderness.     Right lower leg: No edema.     Left lower leg: No edema.     Comments: Patient holding arms above her which she reports is position of comfort but able to range at the  shoulders, elbows, wrists and fingers, no deformity.  Bilateral lower extremities without evidence of deformity.  Skin:    General: Skin is warm and dry.  Neurological:     General: No focal deficit present.     Mental Status: She is alert. Mental status is at baseline.     Comments: No objective sensory deficit to the upper or lower extremities, no cranial nerve deficit.  Strength 5 out of 5 in the bilateral upper and lower extremities.  Pain with light touch to both upper extremities  Psychiatric:        Mood and Affect: Mood normal.        Behavior: Behavior normal.     ED Results and Treatments Labs (all labs ordered are listed, but only abnormal results are displayed) Labs Reviewed  BASIC METABOLIC PANEL WITH GFR - Abnormal; Notable for the following components:      Result Value   Chloride 113 (*)    CO2 15 (*)    Glucose, Bld 101 (*)    Calcium 7.5 (*)    All other components within normal limits  I-STAT CHEM 8, ED - Abnormal; Notable for the following components:   Chloride 113 (*)    Glucose, Bld 100 (*)    Calcium, Ion 0.90 (*)    TCO2 17 (*)    All other components within normal limits  CBC WITH DIFFERENTIAL/PLATELET  HCG, SERUM, QUALITATIVE  HIV ANTIBODY (ROUTINE TESTING W REFLEX)  CBC  CREATININE, SERUM  Radiology MR Cervical Spine Wo Contrast Addendum Date: 12/10/2023 ADDENDUM REPORT: 12/10/2023 12:06 ADDENDUM: Study discussed by telephone with Dr. ELSIE BODY on 12/10/2023 at 1156 hours. Electronically Signed   By: VEAR Hurst M.D.   On: 12/10/2023 12:06   Result Date: 12/10/2023 CLINICAL DATA:  43 year old female status post fall in shower. Neurologic deficit, paresthesia, myelopathy. EXAM: MRI CERVICAL SPINE WITHOUT CONTRAST TECHNIQUE: Multiplanar, multisequence MR imaging of the cervical spine was performed. No intravenous contrast was  administered. COMPARISON:  Cervical spine CT 0844 hours today. FINDINGS: Alignment: Mild additional straightening of lordosis when compared to the CT earlier today. Vertebrae: Bulky degenerative endplate spurring as noted on CT, maximal at C5-C6. See additional details below. Associated chronic degenerative marrow signal changes. Normal background bone marrow signal. No marrow edema or evidence of acute osseous abnormality. Cord: Abnormal. Multilevel degenerative spinal cord mass effect, compression. Patchy abnormal T2 and STIR hyperintense signal within the left hemi cord at C3-C4 (series 6, image 19). Subtle more bilateral heterogeneous signal abnormality within the cord at the C5 vertebral level (series 6, image 25) which is situated between levels of cord mass effect, which is maximal at C5-C6. Axial and sagittal T2 * imaging reveals no convincing spinal cord blood products. No definite cord expansion. Posterior Fossa, vertebral arteries, paraspinal tissues: Cervicomedullary junction is within normal limits. Negative visible posterior fossa. Grossly negative visible brain parenchyma; 4 mm T2 hyperintense area in the posterior pituitary with no regional mass effect or invasion. No further imaging evaluation is necessary. This follows ACR consensus guidelines: Management of Incidental Pituitary Findings on CT, MRI and F18-FDG PET: A White Paper of the ACR Incidental Findings Committee. J Am Coll Radiol 2018; 15: 033-27. Difficult to exclude mild signal abnormality in the right C2-C3 interspinous ligament series 4, image 5. But no other convincing signal abnormality in the cervical spine ligamentous complex. No paraspinal muscle signal abnormality. No prevertebral fluid. Disc levels: C2-C3:  Negative. C3-C4: Moderate sized broad-based and lobulated posterior disc protrusion or extrusion (series 2, image 8). Moderate spinal stenosis with mild spinal cord mass effect. Abnormal left hemi cord signal here. Disc  osteophyte complex at the foramina, moderate bilateral C4 foraminal stenosis. C4-C5: Smaller broad-based posterior and lobulated disc protrusion or small extrusion (series 6, image 23). Mild spinal stenosis and spinal cord mass effect. Foraminal disc osteophyte complex mostly more so on the right. Moderate right C5 foraminal stenosis. C5-C6: Bulky circumferential disc or disc osteophyte complex, seem to be partially calcified posteriorly by CT. Superimposed mild facet and ligament flavum hypertrophy. Moderate spinal stenosis and spinal cord mass effect slightly eccentric to the right (series 6, image 28). Severe bilateral C6 foraminal stenosis greater on the left. C6-C7: Some midline posterior longitudinal ligament thickening or small volume disc migration from the level above (series 2, image 9). Circumferential disc bulging with left paracentral small disc protrusion or extrusion best seen on series 6, image 32 and series 2, image 10. Moderate spinal stenosis and left hemi cord mass effect. Foraminal disc osteophyte complex with mild to moderate C7 foraminal stenosis. C7-T1:  Mild facet hypertrophy.  Otherwise negative. No visible upper thoracic stenosis. IMPRESSION: 1. Age advanced and bulky Cervical Spine degeneration with disc herniations resulting in multilevel spinal stenosis AND cord mass effect C3-C4 through C6-C7 WITH associated spinal cord signal abnormality. The latter is indeterminate for posttraumatic cord contusion versus myelomalacia at C3-C4 (on the left) and at C5. No spinal cord blood products identified. 2. Mild interspinous ligament injury difficult to exclude.  No other ligamentous injury identified. 3. Associated multilevel degenerative neural foraminal stenosis, severe at the bilateral C6 nerve levels. Electronically Signed: By: VEAR Hurst M.D. On: 12/10/2023 11:51   DG Shoulder Left Result Date: 12/10/2023 CLINICAL DATA:  Left shoulder pain after fall today. EXAM: LEFT SHOULDER - 2+ VIEW  COMPARISON:  None Available. FINDINGS: There is no evidence of fracture or dislocation. There is no evidence of arthropathy or other focal bone abnormality. Soft tissues are unremarkable. IMPRESSION: Negative. Electronically Signed   By: Lynwood Landy Raddle M.D.   On: 12/10/2023 10:03   DG Shoulder Right Result Date: 12/10/2023 CLINICAL DATA:  Right shoulder pain after fall today. EXAM: RIGHT SHOULDER - 2+ VIEW COMPARISON:  None Available. FINDINGS: There is no evidence of fracture or dislocation. There is no evidence of arthropathy or other focal bone abnormality. Soft tissues are unremarkable. IMPRESSION: Negative. Electronically Signed   By: Lynwood Landy Raddle M.D.   On: 12/10/2023 10:03   CT Cervical Spine Wo Contrast Result Date: 12/10/2023 EXAM: CT CERVICAL SPINE WITHOUT CONTRAST 12/10/2023 08:53:47 AM TECHNIQUE: CT of the cervical spine was performed without the administration of intravenous contrast. Multiplanar reformatted images are provided for review. Automated exposure control, iterative reconstruction, and/or weight based adjustment of the mA/kV was utilized to reduce the radiation dose to as low as reasonably achievable. COMPARISON: None available. CLINICAL HISTORY: Neck trauma, focal neuro deficit or paresthesia (Age 29-64y). Pt BIB [facility] from home for a mechanical fall in shower. Pt caught self with arms and had to be pulled from shower. Pt is able to move arms but has extremely high sensitivity to touch on arms. Pt hit forehead when she fell as well. Pt is A\T\O. No LOC, no thinners. FINDINGS: CERVICAL SPINE: BONES AND ALIGNMENT: Chronic appearing deformity of the anterior right first rib. 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 DEGENERATIVE CHANGES: Degenerative endplate osteophytes most pronounced at C5-6. Mild disc space narrowing at C5-6. There are disc bulges at multiple levels. Partially calcified disc bulge at C5-6 resulting in at least mild-to-moderate spinal canal stenosis. Facet  arthrosis and uncovertebral hypertrophy at multiple levels. SOFT TISSUES: No prevertebral soft tissue swelling. IMPRESSION: 1. No acute abnormality of the cervical spine related to the reported neck trauma. 2. Degenerative changes greatest at C5-6 where there is at least mild-to-moderate spinal canal stenosis. Electronically signed by: Donnice Mania MD 12/10/2023 09:11 AM EDT RP Workstation: [id number]   CT Head Wo Contrast Result Date: 12/10/2023 EXAM: CT HEAD WITHOUT CONTRAST 12/10/2023 08:53:47 AM TECHNIQUE: CT of the head was performed without the administration of intravenous contrast. Automated exposure control, iterative reconstruction, and/or weight based adjustment of the mA/kV was utilized to reduce the radiation dose to as low as reasonably achievable. COMPARISON: None available. CLINICAL HISTORY: Head trauma, moderate-severe. Pt BIB [facility] from home for a mechanical fall in shower. Pt caught self with arms and had to be pulled from shower. Pt is able to move arms but has extremely high sensitivity to touch on arms. Pt hit forehead when she fell as well. Pt is A\T\O. No LOC, no thinners. FINDINGS: BRAIN AND VENTRICLES: No acute hemorrhage. No evidence of acute infarct. No hydrocephalus. No extra-axial collection. No mass effect or midline shift. ORBITS: No acute abnormality. SINUSES: Mild mucosal thickening in the paranasal sinuses. SOFT TISSUES AND SKULL: No acute soft tissue abnormality. No skull fracture. IMPRESSION: 1. No acute intracranial abnormality related to the reported head trauma. Electronically signed by: Donnice Mania MD 12/10/2023 09:05 AM EDT  RP Workstation: [id number]    Pertinent labs & imaging results that were available during my care of the patient were reviewed by me and considered in my medical decision making (see MDM for details).  Medications Ordered in ED Medications  enoxaparin  (LOVENOX ) injection 40 mg (40 mg Subcutaneous Given 12/10/23 1405)  sodium chloride  flush (NS)  0.9 % injection 3 mL (3 mLs Intravenous Given 12/10/23 1410)  sodium chloride  flush (NS) 0.9 % injection 3 mL (has no administration in time range)  0.9 %  sodium chloride  infusion (has no administration in time range)  acetaminophen  (TYLENOL ) tablet 650 mg (has no administration in time range)    Or  acetaminophen  (TYLENOL ) suppository 650 mg (has no administration in time range)  oxyCODONE  (Oxy IR/ROXICODONE ) immediate release tablet 5 mg (has no administration in time range)  HYDROmorphone  (DILAUDID ) injection 0.5-1 mg (1 mg Intravenous Given 12/10/23 1400)  methocarbamol  (ROBAXIN ) injection 500 mg (has no administration in time range)  docusate sodium  (COLACE) capsule 100 mg (100 mg Oral Given 12/10/23 1403)  ondansetron  (ZOFRAN ) tablet 4 mg (has no administration in time range)    Or  ondansetron  (ZOFRAN ) injection 4 mg (has no administration in time range)  dexamethasone  (DECADRON ) injection 4 mg (4 mg Intravenous Given 12/10/23 1401)  gabapentin  (NEURONTIN ) capsule 600 mg (has no administration in time range)  HYDROmorphone  (DILAUDID ) injection 1 mg (1 mg Intravenous Given 12/10/23 0805)  fentaNYL  (SUBLIMAZE ) injection 50 mcg (50 mcg Intravenous Given 12/10/23 0830)  lactated ringers  bolus 1,000 mL (0 mLs Intravenous Stopped 12/10/23 1118)  HYDROmorphone  (DILAUDID ) injection 1 mg (1 mg Intravenous Given 12/10/23 0929)  gabapentin  (NEURONTIN ) capsule 300 mg (300 mg Oral Given 12/10/23 0948)  acetaminophen  (TYLENOL ) tablet 1,000 mg (1,000 mg Oral Given 12/10/23 0947)  ketorolac  (TORADOL ) 15 MG/ML injection 15 mg (15 mg Intravenous Given 12/10/23 0949)  gabapentin  (NEURONTIN ) capsule 300 mg (300 mg Oral Given 12/10/23 1216)  HYDROmorphone  (DILAUDID ) injection 0.5 mg (0.5 mg Intravenous Given 12/10/23 1215)                                                                                                                                     Procedures .Critical Care  Performed by: Francesca Elsie CROME,  MD Authorized by: Francesca Elsie CROME, MD   Critical care provider statement:    Critical care time (minutes):  30   Critical care was necessary to treat or prevent imminent or life-threatening deterioration of the following conditions:  CNS failure or compromise and trauma   Critical care was time spent personally by me on the following activities:  Development of treatment plan with patient or surrogate, discussions with consultants, evaluation of patient's response to treatment, examination of patient, ordering and review of laboratory studies, ordering and review of radiographic studies, ordering and performing treatments and interventions, pulse oximetry, re-evaluation of patient's condition and review of old charts   Care discussed with: admitting  provider     (including critical care time)  Medical Decision Making / ED Course   MDM:  43 year old presenting after head and neck injury.  Patient appears very uncomfortable.  She is in c-collar.  No strength deficit but reports significant pain in both arms.  No deformity or signs of injury to the arms.  Did obtain shoulder x-rays which showed no evidence of dislocation or fracture in my interpretation.  CT head and cervical spine were obtained with no acute fracture, intracranial bleeding but some degenerative changes and C5-6 stenosis.  Will obtain MRI, some concern for possible central cord syndrome or other spinal cord injury.  Lower extremities seem intact without deficit.  Clinical Course as of 12/10/23 1410  Fri Dec 10, 2023  1407 Workup was notable for MRI showing abnormal cord signal from C3-4 to C6-7.  Grip strength is diminished, other strength seems intact.  Discussed with Dr. Louis, with neurosurgery.  No plan for acute operative intervention but likely will need surgery at some point.  Patient continues to be in severe pain.  He will admit patient to hospitalist for further pain control [WS]    Clinical Course User Index [WS]  Francesca, Elsie CROME, MD     Additional history obtained: -Additional history obtained from ems -External records from outside source obtained and reviewed including: Chart review including previous notes, labs, imaging, consultation notes including prior notes    Lab Tests: -I ordered, reviewed, and interpreted labs.   The pertinent results include:   Labs Reviewed  BASIC METABOLIC PANEL WITH GFR - Abnormal; Notable for the following components:      Result Value   Chloride 113 (*)    CO2 15 (*)    Glucose, Bld 101 (*)    Calcium 7.5 (*)    All other components within normal limits  I-STAT CHEM 8, ED - Abnormal; Notable for the following components:   Chloride 113 (*)    Glucose, Bld 100 (*)    Calcium, Ion 0.90 (*)    TCO2 17 (*)    All other components within normal limits  CBC WITH DIFFERENTIAL/PLATELET  HCG, SERUM, QUALITATIVE  HIV ANTIBODY (ROUTINE TESTING W REFLEX)  CBC  CREATININE, SERUM    Notable for mild nonspecific acidosis   Imaging Studies ordered: I ordered imaging studies including MRI cervical spine On my interpretation imaging demonstrates spinal cord compression I independently visualized and interpreted imaging. I agree with the radiologist interpretation   Medicines ordered and prescription drug management: Meds ordered this encounter  Medications   HYDROmorphone  (DILAUDID ) injection 1 mg   fentaNYL  (SUBLIMAZE ) injection 50 mcg   lactated ringers  bolus 1,000 mL   HYDROmorphone  (DILAUDID ) injection 1 mg   gabapentin  (NEURONTIN ) capsule 300 mg   acetaminophen  (TYLENOL ) tablet 1,000 mg   ketorolac  (TORADOL ) 15 MG/ML injection 15 mg   gabapentin  (NEURONTIN ) capsule 300 mg   HYDROmorphone  (DILAUDID ) injection 0.5 mg   enoxaparin  (LOVENOX ) injection 40 mg   sodium chloride  flush (NS) 0.9 % injection 3 mL   sodium chloride  flush (NS) 0.9 % injection 3 mL   0.9 %  sodium chloride  infusion   OR Linked Order Group    acetaminophen  (TYLENOL ) tablet  650 mg    acetaminophen  (TYLENOL ) suppository 650 mg   oxyCODONE  (Oxy IR/ROXICODONE ) immediate release tablet 5 mg    Refill:  0   HYDROmorphone  (DILAUDID ) injection 0.5-1 mg   methocarbamol  (ROBAXIN ) injection 500 mg   docusate sodium  (COLACE) capsule 100 mg  OR Linked Order Group    ondansetron  (ZOFRAN ) tablet 4 mg    ondansetron  (ZOFRAN ) injection 4 mg   dexamethasone  (DECADRON ) injection 4 mg   gabapentin  (NEURONTIN ) capsule 600 mg    -I have reviewed the patients home medicines and have made adjustments as needed   Consultations Obtained: I requested consultation with the neurosurgery,  and discussed lab and imaging findings as well as pertinent plan - they recommend: admit for pain control   Cardiac Monitoring: The patient was maintained on a cardiac monitor.  I personally viewed and interpreted the cardiac monitored which showed an underlying rhythm of: NSR   Reevaluation: After the interventions noted above, I reevaluated the patient and found that their symptoms have stayed the same  Co morbidities that complicate the patient evaluation  Past Medical History:  Diagnosis Date   Avulsion fracture of ankle 07/03/2021      Dispostion: Disposition decision including need for hospitalization was considered, and patient admitted to the hospital.    Final Clinical Impression(s) / ED Diagnoses Final diagnoses:  Cervical spinal cord injury without spinal bone injury, initial encounter [facility])     This chart was dictated using voice recognition software.  Despite best efforts to proofread,  errors can occur which can change the documentation meaning.    Francesca Elsie CROME, MD 12/10/23 1410

## 2023-12-11 MED ORDER — DEXAMETHASONE SODIUM PHOSPHATE 4 MG/ML IJ SOLN: 4.0000 mg | Freq: Four times a day (QID) | INTRAMUSCULAR | Status: DC

## 2023-12-11 MED ORDER — HALOPERIDOL LACTATE 5 MG/ML IJ SOLN
2.0000 mg | Freq: Four times a day (QID) | INTRAMUSCULAR | Status: DC | PRN
  Administered 2023-12-11: 2 mg via INTRAVENOUS
  Filled 2023-12-11: qty 1

## 2023-12-11 MED ORDER — CARMEX CLASSIC LIP BALM EX OINT
TOPICAL_OINTMENT | CUTANEOUS | Status: DC | PRN
  Filled 2023-12-11: qty 10

## 2023-12-11 MED ORDER — QUETIAPINE FUMARATE 100 MG PO TABS
100.0000 mg | ORAL_TABLET | Freq: Every day | ORAL | Status: DC
  Administered 2023-12-11: 100 mg via ORAL
  Filled 2023-12-11: qty 1

## 2023-12-11 NOTE — Progress Notes (Addendum)
 Pt continues to refuse bed alarm, fall mats and all safety measures to prevent falls. Pt called to have bedside commode cleaned. Pt then walked to nurses station to request bedside commode to be cleaned. Charge nurse cleaned bedside commode and then pt put bedside commode outside of  the room. Pt remains a very high fall risk. Her mood is labile and she is agitated. She is non-compliant with all attempts at care thus far.

## 2023-12-11 NOTE — ED Notes (Signed)
  at bedside

## 2023-12-11 NOTE — ED Notes (Signed)
Pt breakfast tray set up.

## 2023-12-11 NOTE — Progress Notes (Signed)
 Patient refuses fall interventions stating she is not a fall risk. If I fall, I just fall. Patient still adamant that she wants to leave despite understanding the concerns regarding her injury. Patient's mother on the phone during all conversations attempting to encourage the patient to stay. Compression device unhooked because patient is non compliant with call for assistance. She more likely to fall with them on. Duwaine Beck, NP has been made aware of safety concerns and patient's desire to leave against medical advice and that she is very unsteady and refusing interventions. Patient wants to wait and speak with nursing supervisor prior to making a decision about leaving against medical advice.

## 2023-12-11 NOTE — ED Notes (Signed)
 Pt noticed a lump on her left inner pinky. She states it is very sensitive touch.

## 2023-12-11 NOTE — Progress Notes (Signed)
 Pt taking photos/videos of staff in the room. RN educated pt that she is not allowed to do that. Pt increasingly agitated. Security called to bedside.

## 2023-12-11 NOTE — Progress Notes (Addendum)
 Pt stated she has called for assistance x 3 since shift change, however, the secretary reports only 1 call, in which primary RN responded. When RN entered room and asked pt how she could help her, pt responded, I'm fine. RN asked pt if she could do an assessment and pt responded that someone had just done one. RN explained each nurse has to do their own assessment. Pt consented. When RN asked pt to squeeze her hands, pt slightly squeezed RN's hands and immediately withdrew hands and yelled that RN squeezed her sore finger, despite the RN not squeezing back on the pt hands. Pt then continued yelling that no one can touch her arms and that is why she is here.

## 2023-12-11 NOTE — ED Notes (Signed)
 Pt used bedpan and received PRN pain medication.

## 2023-12-11 NOTE — Plan of Care (Signed)
  Problem: Education: Goal: Knowledge of General Education information will improve Description: Including pain rating scale, medication(s)/side effects and non-pharmacologic comfort measures Outcome: Not Progressing   Problem: Health Behavior/Discharge Planning: Goal: Ability to manage health-related needs will improve Outcome: Not Progressing   Problem: Clinical Measurements: Goal: Respiratory complications will improve Outcome: Not Applicable Goal: Cardiovascular complication will be avoided Outcome: Not Applicable

## 2023-12-11 NOTE — ED Notes (Signed)
 Pt used bedpan. MD notified of urinary frequency.

## 2023-12-11 NOTE — ED Notes (Signed)
 The pt reports that she has less pain or tingle  in her forearms but her fingers on both hands are still very sensitive.  She will barely let me  give her the pain med in her iv because she reports that she has pain with movement

## 2023-12-11 NOTE — Progress Notes (Signed)
 Patient with much less dysesthetic pain in her upper extremities.  Symptoms mostly confined to her C8 distribution bilaterally now.  Better strength and function with her arms and hands.  Patient has been able to void.  She is still waiting hospital bed placement.  She is awake and alert.  She is oriented and appropriate.  Motor examination reveals good proximal strength in both upper extremities with near normal deltoid and biceps muscle strength.  Wrist extension is good.  Triceps function is good bilaterally.  She is now able to grip but full grip testing cannot be done secondary to her dysesthetic pain.  She has 3/5 intrinsic strength in both hands.  She has normal lower extremity strength.  Status post fall with significant cervical central cord injury.  Patient is responding to Decadron  gabapentin  and cervical immobilization.  Continue efforts at slow mobilization and pain control.  Patient will eventually require multilevel anterior cervical decompression and fusion but ideally this would wait until she recovers further from her acute traumatic injury.

## 2023-12-11 NOTE — Progress Notes (Signed)
 Patient is upset regarding care in the ED and that she lost her  vape pen.  Vape pen was found in linen and given the patient's son and explained that she could not keep it because its against policy. Son the gave the patient the pen anyway. It was found in the bed under the patient after she denied having it. Also patient is a high fall risk admitted for fall and spine injury. Bed alarm, non-skid socks are on. Patient refuses to call for assistance. She has gotten up multiple times with scds still attached very unsteady on her feet. She is very upset and feels like she is being treated like a child. The patient is very likely to fall. She is completely alert and oriented x4. Her son is at bedside trying to calm and explain the process to the patient. She was more concerned with her belongings. She says her earbuds were in the linen but I did not find them, only the vape pen. She now wants to leave AMA. Message has been sent to attending physician to come speak to patient. AC and 3w Charge nurse at bedside attempted to address concerns.

## 2023-12-11 NOTE — ED Notes (Signed)
Pt used bedpan 

## 2023-12-11 NOTE — ED Notes (Signed)
 RN assisted pt complete a partial bath and brushed teeth. Pt provided toiletries.  Linen changed

## 2023-12-12 LAB — HIV ANTIBODY (ROUTINE TESTING W REFLEX): HIV Screen 4th Generation wRfx: NONREACTIVE

## 2023-12-12 MED ORDER — GABAPENTIN 300 MG PO CAPS: 600.0000 mg | ORAL_CAPSULE | Freq: Three times a day (TID) | ORAL | 1 refills | Status: AC

## 2023-12-12 MED ORDER — OXYCODONE HCL 5 MG PO TABS: 5.0000 mg | ORAL_TABLET | ORAL | 0 refills | Status: AC | PRN

## 2023-12-12 MED ORDER — METHOCARBAMOL 500 MG PO TABS: 500.0000 mg | ORAL_TABLET | Freq: Four times a day (QID) | ORAL | 1 refills | Status: AC | PRN

## 2023-12-12 MED ORDER — METHYLPREDNISOLONE 4 MG PO TBPK: ORAL_TABLET | ORAL | 0 refills | Status: AC

## 2023-12-12 NOTE — Discharge Summary (Signed)
 Physician Discharge Summary     Providing Compassionate, Quality Care - Together   Patient ID: Amy Madden MRN: [id number] DOB/AGE: 1980/11/21 43 y.o.  Admit date: 12/10/2023 Discharge date: 12/12/2023  Admission Diagnoses: Cervical spinal cord injury  Discharge Diagnoses:  Principal Problem:   Cervical spinal cord injury [facility])   Discharged Condition: fair  Hospital Course: Patient suffered a fall at home, sustaining a cervical spinal cord injury. She was admitted to 3W20 from the emergency department, where she received IV steroids and worked on pain control. Her mobility and dexterity did improve with the steroids. The patient did seem to become somewhat agitated with the high dose steroids. These were discontinued the evening of 12/11/2023. She has ambulated independently. It was recommended she have someone with her at home to help prevent falls. She is tolerating a normal diet. She is not having any bowel or bladder dysfunction. Her pain is reasonably controlled with oral pain medication. She will be discharged home with the instructions to follow up with Dr. Louis in one week to discuss surgery.   Consults: None  Significant Diagnostic Studies: MR Cervical Spine Wo Contrast Addendum Date: 12/10/2023 ADDENDUM REPORT: 12/10/2023 12:06 ADDENDUM: Study discussed by telephone with Dr. ELSIE BODY on 12/10/2023 at 1156 hours. Electronically Signed   By: VEAR Hurst M.D.   On: 12/10/2023 12:06   Result Date: 12/10/2023 CLINICAL DATA:  43 year old female status post fall in shower. Neurologic deficit, paresthesia, myelopathy. EXAM: MRI CERVICAL SPINE WITHOUT CONTRAST TECHNIQUE: Multiplanar, multisequence MR imaging of the cervical spine was performed. No intravenous contrast was administered. COMPARISON:  Cervical spine CT 0844 hours today. FINDINGS: Alignment: Mild additional straightening of lordosis when compared to the CT earlier today. Vertebrae: Bulky degenerative endplate spurring  as noted on CT, maximal at C5-C6. See additional details below. Associated chronic degenerative marrow signal changes. Normal background bone marrow signal. No marrow edema or evidence of acute osseous abnormality. Cord: Abnormal. Multilevel degenerative spinal cord mass effect, compression. Patchy abnormal T2 and STIR hyperintense signal within the left hemi cord at C3-C4 (series 6, image 19). Subtle more bilateral heterogeneous signal abnormality within the cord at the C5 vertebral level (series 6, image 25) which is situated between levels of cord mass effect, which is maximal at C5-C6. Axial and sagittal T2 * imaging reveals no convincing spinal cord blood products. No definite cord expansion. Posterior Fossa, vertebral arteries, paraspinal tissues: Cervicomedullary junction is within normal limits. Negative visible posterior fossa. Grossly negative visible brain parenchyma; 4 mm T2 hyperintense area in the posterior pituitary with no regional mass effect or invasion. No further imaging evaluation is necessary. This follows ACR consensus guidelines: Management of Incidental Pituitary Findings on CT, MRI and F18-FDG PET: A White Paper of the ACR Incidental Findings Committee. J Am Coll Radiol 2018; 15: 033-27. Difficult to exclude mild signal abnormality in the right C2-C3 interspinous ligament series 4, image 5. But no other convincing signal abnormality in the cervical spine ligamentous complex. No paraspinal muscle signal abnormality. No prevertebral fluid. Disc levels: C2-C3:  Negative. C3-C4: Moderate sized broad-based and lobulated posterior disc protrusion or extrusion (series 2, image 8). Moderate spinal stenosis with mild spinal cord mass effect. Abnormal left hemi cord signal here. Disc osteophyte complex at the foramina, moderate bilateral C4 foraminal stenosis. C4-C5: Smaller broad-based posterior and lobulated disc protrusion or small extrusion (series 6, image 23). Mild spinal stenosis and spinal  cord mass effect. Foraminal disc osteophyte complex mostly more so on the right. Moderate right C5  foraminal stenosis. C5-C6: Bulky circumferential disc or disc osteophyte complex, seem to be partially calcified posteriorly by CT. Superimposed mild facet and ligament flavum hypertrophy. Moderate spinal stenosis and spinal cord mass effect slightly eccentric to the right (series 6, image 28). Severe bilateral C6 foraminal stenosis greater on the left. C6-C7: Some midline posterior longitudinal ligament thickening or small volume disc migration from the level above (series 2, image 9). Circumferential disc bulging with left paracentral small disc protrusion or extrusion best seen on series 6, image 32 and series 2, image 10. Moderate spinal stenosis and left hemi cord mass effect. Foraminal disc osteophyte complex with mild to moderate C7 foraminal stenosis. C7-T1:  Mild facet hypertrophy.  Otherwise negative. No visible upper thoracic stenosis. IMPRESSION: 1. Age advanced and bulky Cervical Spine degeneration with disc herniations resulting in multilevel spinal stenosis AND cord mass effect C3-C4 through C6-C7 WITH associated spinal cord signal abnormality. The latter is indeterminate for posttraumatic cord contusion versus myelomalacia at C3-C4 (on the left) and at C5. No spinal cord blood products identified. 2. Mild interspinous ligament injury difficult to exclude. No other ligamentous injury identified. 3. Associated multilevel degenerative neural foraminal stenosis, severe at the bilateral C6 nerve levels. Electronically Signed: By: VEAR Hurst M.D. On: 12/10/2023 11:51   DG Shoulder Left Result Date: 12/10/2023 CLINICAL DATA:  Left shoulder pain after fall today. EXAM: LEFT SHOULDER - 2+ VIEW COMPARISON:  None Available. FINDINGS: There is no evidence of fracture or dislocation. There is no evidence of arthropathy or other focal bone abnormality. Soft tissues are unremarkable. IMPRESSION: Negative.  Electronically Signed   By: Lynwood Landy Raddle M.D.   On: 12/10/2023 10:03   DG Shoulder Right Result Date: 12/10/2023 CLINICAL DATA:  Right shoulder pain after fall today. EXAM: RIGHT SHOULDER - 2+ VIEW COMPARISON:  None Available. FINDINGS: There is no evidence of fracture or dislocation. There is no evidence of arthropathy or other focal bone abnormality. Soft tissues are unremarkable. IMPRESSION: Negative. Electronically Signed   By: Lynwood Landy Raddle M.D.   On: 12/10/2023 10:03   CT Cervical Spine Wo Contrast Result Date: 12/10/2023 EXAM: CT CERVICAL SPINE WITHOUT CONTRAST 12/10/2023 08:53:47 AM TECHNIQUE: CT of the cervical spine was performed without the administration of intravenous contrast. Multiplanar reformatted images are provided for review. Automated exposure control, iterative reconstruction, and/or weight based adjustment of the mA/kV was utilized to reduce the radiation dose to as low as reasonably achievable. COMPARISON: None available. CLINICAL HISTORY: Neck trauma, focal neuro deficit or paresthesia (Age 28-64y). Pt BIB [facility] from home for a mechanical fall in shower. Pt caught self with arms and had to be pulled from shower. Pt is able to move arms but has extremely high sensitivity to touch on arms. Pt hit forehead when she fell as well. Pt is A\T\O. No LOC, no thinners. FINDINGS: CERVICAL SPINE: BONES AND ALIGNMENT: Chronic appearing deformity of the anterior right first rib. 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 DEGENERATIVE CHANGES: Degenerative endplate osteophytes most pronounced at C5-6. Mild disc space narrowing at C5-6. There are disc bulges at multiple levels. Partially calcified disc bulge at C5-6 resulting in at least mild-to-moderate spinal canal stenosis. Facet arthrosis and uncovertebral hypertrophy at multiple levels. SOFT TISSUES: No prevertebral soft tissue swelling. IMPRESSION: 1. No acute abnormality of the cervical spine related to the reported neck trauma. 2.  Degenerative changes greatest at C5-6 where there is at least mild-to-moderate spinal canal stenosis. Electronically signed by: Donnice Mania MD 12/10/2023 09:11 AM  EDT RP Workstation: [id number]   CT Head Wo Contrast Result Date: 12/10/2023 EXAM: CT HEAD WITHOUT CONTRAST 12/10/2023 08:53:47 AM TECHNIQUE: CT of the head was performed without the administration of intravenous contrast. Automated exposure control, iterative reconstruction, and/or weight based adjustment of the mA/kV was utilized to reduce the radiation dose to as low as reasonably achievable. COMPARISON: None available. CLINICAL HISTORY: Head trauma, moderate-severe. Pt BIB [facility] from home for a mechanical fall in shower. Pt caught self with arms and had to be pulled from shower. Pt is able to move arms but has extremely high sensitivity to touch on arms. Pt hit forehead when she fell as well. Pt is A\T\O. No LOC, no thinners. FINDINGS: BRAIN AND VENTRICLES: No acute hemorrhage. No evidence of acute infarct. No hydrocephalus. No extra-axial collection. No mass effect or midline shift. ORBITS: No acute abnormality. SINUSES: Mild mucosal thickening in the paranasal sinuses. SOFT TISSUES AND SKULL: No acute soft tissue abnormality. No skull fracture. IMPRESSION: 1. No acute intracranial abnormality related to the reported head trauma. Electronically signed by: Donnice Mania MD 12/10/2023 09:05 AM EDT RP Workstation: [id number]     Treatments: steroids: decadron   Discharge Exam: Blood pressure 129/76, pulse 68, temperature 98.3 F (36.8 C), temperature source Oral, resp. rate 19, height 5' 7 (1.702 m), weight 81.6 kg, last menstrual period 11/05/2023, SpO2 99%.  Alert and oriented x 4 Agitated PERRLA CN II-XII grossly intact MAE, Strength improved since admission. Still hyperpathic in BUE  Disposition: Discharge disposition: 01-Home or Self Care       Discharge Instructions     Call MD for:  difficulty breathing, headache or  visual disturbances   Complete by: As directed    Call MD for:  hives   Complete by: As directed    Call MD for:  persistant nausea and vomiting   Complete by: As directed    Call MD for:  severe uncontrolled pain   Complete by: As directed    Diet - low sodium heart healthy   Complete by: As directed    Discharge instructions   Complete by: As directed    Wear hard cervical collar at all times. May remove to shower.   Increase activity slowly   Complete by: As directed    Lifting restrictions   Complete by: As directed    Avoid overhead lifting.      Allergies as of 12/12/2023       Reactions   No [employer] Food Allergies Hives   Tomatos, Eggs, and Chocolate        Medication List     STOP taking these medications    ibuprofen 200 MG tablet Commonly known as: ADVIL       TAKE these medications    gabapentin  300 MG capsule Commonly known as: NEURONTIN  Take 2 capsules (600 mg total) by mouth 3 (three) times daily.   methocarbamol  500 MG tablet Commonly known as: ROBAXIN  Take 1 tablet (500 mg total) by mouth every 6 (six) hours as needed for muscle spasms.   methylPREDNISolone  4 MG Tbpk tablet Commonly known as: MEDROL  DOSEPAK follow package directions   oxyCODONE  5 MG immediate release tablet Commonly known as: Oxy IR/ROXICODONE  Take 1 tablet (5 mg total) by mouth every 4 (four) hours as needed for moderate pain (pain score 4-6).        Follow-up Information     Louis Shove, MD. Schedule an appointment as soon as possible for a visit in 1 week(s).  Specialty: Neurosurgery Contact information: [address] [place] [address] [phone]                 Signed: Gerard Beck, DNP, AGNP-C Nurse Practitioner  [facility] [address], [address], [place], [place] [address] P: [phone]    F: [phone]  12/12/2023, 12:01 PM

## 2023-12-12 NOTE — Progress Notes (Signed)
 Patient overall stable.  Still with dysesthetic pain in both hands.  Her strength is slowly improving.  She is standing and walking well.  She is voiding without difficulty.  She is very uncomfortable in the hospital and does not feel like she is getting the care she needs.  She would prefer to be home.  I think it is reasonable for her to be discharged home.  She will continue with her cervical collar.  We will send her home with gabapentin  and Medrol  dose pack.  She will follow-up with me in the office later this week.  Will discuss multilevel anterior cervical decompression and fusion at her follow-up appointment.

## 2023-12-12 NOTE — Progress Notes (Signed)
 Discharge instructions given to patient, patient verbalizes an understanding of discharge instructions. X1 PIV removed site clean dry and intact. Expressed the importance of taking home medications as prescribed and keeping up with all follow up appointments. Patient waiting on transportation at this time for disposition home at this time.

## 2023-12-12 NOTE — Progress Notes (Addendum)
 Patient calmed down and was able to sleep after receiving the Haldol  and Seroquel .  I did explain exactly what the medications were for before giving them to the patient to make sure she was comfortable taking them.

## 2023-12-12 NOTE — Plan of Care (Signed)
  Problem: Education: Goal: Knowledge of General Education information will improve Description: Including pain rating scale, medication(s)/side effects and non-pharmacologic comfort measures 12/12/2023 1220 by Jenel Bobetta SAILOR, RN Outcome: Adequate for Discharge 12/12/2023 1003 by Jenel Bobetta SAILOR, RN Outcome: Progressing   Problem: Health Behavior/Discharge Planning: Goal: Ability to manage health-related needs will improve 12/12/2023 1220 by Jenel Bobetta SAILOR, RN Outcome: Adequate for Discharge 12/12/2023 1003 by Jenel Bobetta SAILOR, RN Outcome: Progressing   Problem: Clinical Measurements: Goal: Ability to maintain clinical measurements within normal limits will improve 12/12/2023 1220 by Jenel Bobetta SAILOR, RN Outcome: Adequate for Discharge 12/12/2023 1003 by Jenel Bobetta SAILOR, RN Outcome: Progressing Goal: Will remain free from infection 12/12/2023 1220 by Jenel Bobetta SAILOR, RN Outcome: Adequate for Discharge 12/12/2023 1003 by Jenel Bobetta SAILOR, RN Outcome: Progressing Goal: Diagnostic test results will improve 12/12/2023 1220 by Jenel Bobetta SAILOR, RN Outcome: Adequate for Discharge 12/12/2023 1003 by Jenel Bobetta SAILOR, RN Outcome: Progressing   Problem: Activity: Goal: Risk for activity intolerance will decrease 12/12/2023 1220 by Jenel Bobetta SAILOR, RN Outcome: Adequate for Discharge 12/12/2023 1003 by Jenel Bobetta SAILOR, RN Outcome: Progressing   Problem: Nutrition: Goal: Adequate nutrition will be maintained 12/12/2023 1220 by Jenel Bobetta SAILOR, RN Outcome: Adequate for Discharge 12/12/2023 1003 by Jenel Bobetta SAILOR, RN Outcome: Progressing   Problem: Coping: Goal: Level of anxiety will decrease 12/12/2023 1220 by Jenel Bobetta SAILOR, RN Outcome: Adequate for Discharge 12/12/2023 1003 by Jenel Bobetta SAILOR, RN Outcome: Progressing   Problem: Elimination: Goal: Will not experience complications related to bowel motility 12/12/2023 1220 by Jenel Bobetta SAILOR, RN Outcome: Adequate for Discharge 12/12/2023  1003 by Jenel Bobetta SAILOR, RN Outcome: Progressing Goal: Will not experience complications related to urinary retention 12/12/2023 1220 by Jenel Bobetta SAILOR, RN Outcome: Adequate for Discharge 12/12/2023 1003 by Jenel Bobetta SAILOR, RN Outcome: Progressing   Problem: Pain Managment: Goal: General experience of comfort will improve and/or be controlled 12/12/2023 1220 by Jenel Bobetta SAILOR, RN Outcome: Adequate for Discharge 12/12/2023 1003 by Jenel Bobetta SAILOR, RN Outcome: Progressing   Problem: Safety: Goal: Ability to remain free from injury will improve 12/12/2023 1220 by Jenel Bobetta SAILOR, RN Outcome: Adequate for Discharge 12/12/2023 1003 by Jenel Bobetta SAILOR, RN Outcome: Progressing   Problem: Skin Integrity: Goal: Risk for impaired skin integrity will decrease 12/12/2023 1220 by Jenel Bobetta SAILOR, RN Outcome: Adequate for Discharge 12/12/2023 1003 by Jenel Bobetta SAILOR, RN Outcome: Progressing

## 2023-12-12 NOTE — Plan of Care (Signed)
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
## 2023-12-17 ENCOUNTER — Other Ambulatory Visit: Payer: Self-pay | Admitting: Neurosurgery

## 2023-12-24 ENCOUNTER — Ambulatory Visit: Admitting: Family

## 2024-01-14 ENCOUNTER — Inpatient Hospital Stay (HOSPITAL_COMMUNITY): Admit: 2024-01-14 | Payer: MEDICAID | Admitting: Neurosurgery

## 2024-01-14 SURGERY — ANTERIOR CERVICAL DECOMPRESSION/DISCECTOMY FUSION 4 LEVELS
Anesthesia: General
